# Patient Record
Sex: Female | Born: 1990 | Race: Black or African American | Hispanic: No | Marital: Single
Health system: Southern US, Community
[De-identification: ages and names within clinical notes are randomized; demographics above are authoritative.]

## PROBLEM LIST (undated history)

## (undated) DIAGNOSIS — F329 Major depressive disorder, single episode, unspecified: Secondary | ICD-10-CM

## (undated) DIAGNOSIS — F32A Depression, unspecified: Secondary | ICD-10-CM

## (undated) DIAGNOSIS — F259 Schizoaffective disorder, unspecified: Secondary | ICD-10-CM

## (undated) DIAGNOSIS — F988 Other specified behavioral and emotional disorders with onset usually occurring in childhood and adolescence: Secondary | ICD-10-CM

## (undated) HISTORY — PX: WISDOM TOOTH EXTRACTION: SHX21

---
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
## 2014-05-12 ENCOUNTER — Encounter (HOSPITAL_BASED_OUTPATIENT_CLINIC_OR_DEPARTMENT_OTHER): Payer: Self-pay | Admitting: *Deleted

## 2014-05-12 ENCOUNTER — Emergency Department (HOSPITAL_BASED_OUTPATIENT_CLINIC_OR_DEPARTMENT_OTHER): Payer: Medicaid - Out of State

## 2014-05-12 ENCOUNTER — Emergency Department (HOSPITAL_BASED_OUTPATIENT_CLINIC_OR_DEPARTMENT_OTHER)
Admission: EM | Admit: 2014-05-12 | Discharge: 2014-05-12 | Disposition: A | Discharge status: Home / Self Care | Payer: Medicaid - Out of State | Attending: Emergency Medicine | Admitting: Emergency Medicine

## 2014-05-12 DIAGNOSIS — X58XXXA Exposure to other specified factors, initial encounter: Secondary | ICD-10-CM | POA: Diagnosis not present

## 2014-05-12 DIAGNOSIS — F909 Attention-deficit hyperactivity disorder, unspecified type: Secondary | ICD-10-CM | POA: Diagnosis not present

## 2014-05-12 DIAGNOSIS — Y9389 Activity, other specified: Secondary | ICD-10-CM | POA: Insufficient documentation

## 2014-05-12 DIAGNOSIS — F329 Major depressive disorder, single episode, unspecified: Secondary | ICD-10-CM | POA: Insufficient documentation

## 2014-05-12 DIAGNOSIS — Z79899 Other long term (current) drug therapy: Secondary | ICD-10-CM | POA: Diagnosis not present

## 2014-05-12 DIAGNOSIS — Y9289 Other specified places as the place of occurrence of the external cause: Secondary | ICD-10-CM | POA: Diagnosis not present

## 2014-05-12 DIAGNOSIS — S99921A Unspecified injury of right foot, initial encounter: Secondary | ICD-10-CM | POA: Diagnosis present

## 2014-05-12 DIAGNOSIS — Y998 Other external cause status: Secondary | ICD-10-CM | POA: Insufficient documentation

## 2014-05-12 DIAGNOSIS — S92251A Displaced fracture of navicular [scaphoid] of right foot, initial encounter for closed fracture: Secondary | ICD-10-CM | POA: Diagnosis not present

## 2014-05-12 DIAGNOSIS — Z720 Tobacco use: Secondary | ICD-10-CM | POA: Insufficient documentation

## 2014-05-12 HISTORY — DX: Schizoaffective disorder, unspecified: F25.9

## 2014-05-12 HISTORY — DX: Other specified behavioral and emotional disorders with onset usually occurring in childhood and adolescence: F98.8

## 2014-05-12 HISTORY — DX: Depression, unspecified: F32.A

## 2014-05-12 HISTORY — DX: Major depressive disorder, single episode, unspecified: F32.9

## 2014-05-12 MED ORDER — KETOROLAC TROMETHAMINE 60 MG/2ML IM SOLN
60.0000 mg | Freq: Once | INTRAMUSCULAR | Status: AC
  Administered 2014-05-12: 60 mg via INTRAMUSCULAR
  Filled 2014-05-12: qty 2

## 2014-05-12 NOTE — ED Notes (Signed)
Pt amb to room 12 with quick steady gait in nad. Pt report she has been doing a new workout routine and has been walking a lot, "I know I'm wearing the wrong shoes, I wear converse, and they have no support..." pt reports pain to her right arch area, has tried massage, warm soaks, tylenol and aleve, no relief. Pt advised by this rn to get good arch supportive shoes for working out, or orthotic inserts for support and to try ice also.

## 2014-05-12 NOTE — ED Notes (Signed)
D/c home- f/u appt scheduled by pt while in ED- crutches, cam walker and ice pack for home use

## 2014-05-12 NOTE — Discharge Instructions (Signed)
Return to the emergency room with worsening of symptoms, new symptoms or with symptoms that are concerning, especially severe pain, fevers, numbness, tingling, swelling ,redness. RICE: Rest, Ice (three cycles of 20 mins on, off at least twice a day), compression/brace, elevation. Heating pad works well for back pain. Ibuprofen  (2 tablets ) every 5-6 hours for 3-5 days. Cam boot and weight bearing as tolerated. Call to make appointment with sports medicine above.  Read below information and follow recommendations.  Fracture A fracture is a break in a bone, due to a force on the bone that is greater than the bone's strength can handle. There are many types of fractures, including:  Complete fracture: The break passes completely through the bone.  Displaced: The ends of the bone fragments are not properly aligned.  Non-displaced: The ends of the bone fragments are in proper alignment.  Incomplete fracture (greenstick): The break does not pass completely through the bone. Incomplete fractures may or may not be angular (angulated).  Open fracture (compound): Part of the broken bone pokes through the skin. Open fractures have a high risk for infection.  Closed fracture: The fracture has not broken through the skin.  Comminuted fracture: The bone is broken into more than two pieces.  Compression fracture: The break occurs from extreme pressure on the bone (includes crushing injury).  Impacted fracture: The broken bone ends have been driven into each other.  Avulsion fracture: A ligament or tendon pulls a small piece of bone off from the main bony segment.  Pathologic fracture: A fracture due to the bone being made weak by a disease (osteoporosis or tumors).  Stress fracture: A fracture caused by intense exercise or repetitive and prolonged pressure that makes the bone weak. SYMPTOMS   Pain, tenderness, bleeding, bruising, and swelling at the fracture site.  Weakness  and inability to bear weight on the injured extremity.  Paleness and deformity (sometimes).  Loss of pulse, numbness, tingling, or paralysis below the fracture site (usually a limb); these are emergencies. CAUSES  Bone being subjected to a force greater than its strength. RISK INCREASES WITH:  Contact sports and falls from heights.  Previous or current bone problems (osteoporosis or tumors).  Poor balance.  Poor strength and flexibility. PREVENTION   Warm up and stretch properly before activity.  Maintain physical fitness:  Cardiovascular fitness.  Muscle strength.  Flexibility and endurance.  Wear proper protective equipment.  Use proper exercise technique. RELATED COMPLICATIONS   Bone fails to heal (nonunion).  Bone heals in a poor position (malunion).  Low blood volume (hypovolemic), shock due to blood loss.  Clump of fat cells travels through the blood (fat embolus) from the injury site to the lungs or brain (more common with thigh fractures).  Obstruction of nearby arteries. TREATMENT  Treatment first requires realigning of the bones (reduction) by a medically trained person, if the fracture is displaced. After realignment if the fracture is completed, or for non-displaced fractures, ice and medicine are used to reduce pain and inflammation. The bone and adjacent joints are then restrained with a splint, cast, or brace to allow the bones to heal without moving. Surgery is sometimes needed, to reposition the bones and hold the position with rods, pins, plates, or screws. Restraint for long periods of time may result in muscle and joint weakness or build up of fluid in tissues (edema). For this reason, physical therapy is often needed to regain strength and full range of motion. Recovery is complete when  there is no bone motion at the fracture site and x-rays (radiographs) show complete healing.  MEDICATION   General anesthesia, sedation, or muscle relaxants may be  needed to allow for realignment of the fracture. If pain medicine is needed, nonsteroidal anti-inflammatory medicines (aspirin and ibuprofen), or other minor pain relievers (acetaminophen), are often advised.  Do not take pain medicine for 7 days before surgery.  Stronger pain relievers may be prescribed by your caregiver. Use only as directed and only as much as you need. SEEK MEDICAL CARE IF:   The following occur after restraint or surgery. (Report any of these signs immediately):  Swelling above or below the fracture site.  Severe, persistent pain.  Blue or gray skin below the fracture site, especially under the nails. Numbness or loss of feeling below the fracture site. Document Released: 12/27/2004 Document Revised: 12/14/2011 Document Reviewed: 04/10/2008 [facility] Patient Information 2015 [place], [place]. This information is not intended to replace advice given to you by your health care provider. Make sure you discuss any questions you have with your health care provider.

## 2014-05-12 NOTE — ED Notes (Signed)
PA at bedside.

## 2014-05-12 NOTE — ED Provider Notes (Signed)
CSN: [id number]     Arrival date & time 05/12/14  1129 History   First MD Initiated Contact with Patient 05/12/14 1145     Chief Complaint  Patient presents with  . Foot Pain     (Consider location/radiation/quality/duration/timing/severity/associated sxs/prior Treatment) HPI  Erica Dixon is a 24 y.o. female with PMH of schizoaffective disorder, depression, ADHD presenting with pain to right arch area for 3 days that is intermittent and described as aching. She denies improvement with massage, warm says, Tylenol or Aleve. Patient states she has been working out more than usual which includes walking. She reports improvement of her symptoms with lace up sneaker instead of converse shoes. She denies numbness, tingling. No history of injury.   Past Medical History  Diagnosis Date  . Depression   . Schizoaffective disorder   . ADD (attention deficit disorder)    History reviewed. No pertinent past surgical history. History reviewed. No pertinent family history. History  Substance Use Topics  . Smoking status: Current Some Day Smoker  . Smokeless tobacco: Not on file  . Alcohol Use: Not on file   OB History    No data available     Review of Systems  Musculoskeletal: Positive for myalgias. Negative for joint swelling.  Skin: Negative for color change and wound.  Neurological: Negative for weakness and numbness.      Allergies  Review of patient's allergies indicates no known allergies.  Home Medications   Prior to Admission medications   Medication Sig Start Date End Date Taking? Authorizing Provider  buPROPion (WELLBUTRIN) 100 MG tablet Take 200 mg by mouth 2 (two) times daily.   Yes Historical Provider, MD  desogestrel-ethinyl estradiol (KARIVA,AZURETTE,MIRCETTE) 0.15-0.02/0.01 MG (21/5) tablet Take 1 tablet by mouth daily.   Yes Historical Provider, MD   BP 135/81 mmHg  Pulse 94  Temp(Src) 98.3 F (36.8 C) (Oral)  Resp 18  Ht 5\' 5"  (1.651 m)  Wt 212 lb (96.163  kg)  BMI 35.28 kg/m2  SpO2 100%  LMP 05/05/2014 Physical Exam  Constitutional: She appears well-developed and well-nourished. No distress.  HENT:  Head: Normocephalic and atraumatic.  Eyes: Conjunctivae are normal. Right eye exhibits no discharge. Left eye exhibits no discharge.  Cardiovascular:  2+ DP, PT pulses equal bilaterally  Pulmonary/Chest: Effort normal. No respiratory distress.  Musculoskeletal:  Tenderness to home or right arch without overlying skin changes. No other tenderness. Full range of motion of ankle without tenderness. Achilles tendon intact.  Neurological: She is alert. Coordination normal.  Strength and sensation intact.  Skin: She is not diaphoretic.  Psychiatric: She has a normal mood and affect. Her behavior is normal.  Nursing note and vitals reviewed.   ED Course  Procedures (including critical care time) Labs Review Labs Reviewed - No data to display  Imaging Review Dg Foot Complete Right  05/12/2014   CLINICAL DATA:  Right medial foot pain for 1 week, no known injury  EXAM: RIGHT FOOT COMPLETE - 3+ VIEW  COMPARISON:  None.  FINDINGS: Three views of the right foot submitted. There is subtle lucent line dorsal posterior aspect of navicular seen on lateral view. Osteochondral defect or nondisplaced fracture cannot be excluded. Clinical correlation is necessary.  IMPRESSION: There is subtle lucent line dorsal posterior aspect of navicular seen on lateral view. Osteochondral defect or nondisplaced fracture cannot be excluded. Clinical correlation is necessary.   Electronically Signed   By: Natasha MeadLiviu  Pop M.D.   On: 05/12/2014 12:59     EKG  Interpretation None      MDM   Final diagnoses:  Closed right tarsal navicular fracture, initial encounter   Patient presenting with right arch pain worse with activity. Neurovascularly intact. X-ray with evidence of possible nondisplaced fracture of navicular. Patient to be placed in cam boot and ambulate as tolerated.  She'll follow-up with sports medicine. Discussed rice and ibuprofen use.  Discussed return precautions with patient. Discussed all results and patient verbalizes understanding and agrees with plan.     Oswaldo Conroy, PA-C 05/12/14 1312  Blane Ohara, MD 05/14/14 [phone]

## 2014-05-14 ENCOUNTER — Ambulatory Visit (INDEPENDENT_AMBULATORY_CARE_PROVIDER_SITE_OTHER): Payer: 59 | Admitting: Family Medicine

## 2014-05-14 ENCOUNTER — Encounter: Payer: Self-pay | Admitting: Family Medicine

## 2014-05-14 VITALS — BP 128/88 | HR 112 | Ht 65.0 in | Wt 210.0 lb

## 2014-05-14 DIAGNOSIS — M79671 Pain in right foot: Secondary | ICD-10-CM

## 2014-05-14 NOTE — Patient Instructions (Signed)
You have plantar fasciitis While your x-rays showed a possible navicular fracture you have no tenderness here and your activity level isn't consistent with a stress fracture. Tylenol 500mg  1-2 tabs three times a day. Aleve 2 tabs twice a day with food OR ibuprofen 600mg  three times a day with food for pain and inflammation. Wear cam walker for next 2 weeks. Consider using crutches if severely painful but weight bear as tolerated. Follow up with me in 2 weeks.  At that time hopefully we can start you on the exercises we discussed: Plantar fascia stretch for 20-30 seconds (do 3 of these) in morning Lowering/raise on a step exercises 3 x 10 once or twice a day - this is very important for long term recovery. Can add heel walks, toe walks forward and backward as well  Ice for 15 minutes as needed. Avoid flat shoes/barefoot walking as much as possible. Arch straps have been shown to help with pain though  Orthotics with heel lift may be helpful (like dr. Jari Sportsmanscholls active series insoles).

## 2014-05-19 DIAGNOSIS — M79671 Pain in right foot: Secondary | ICD-10-CM | POA: Insufficient documentation

## 2014-05-19 NOTE — Progress Notes (Signed)
PCP: No PCP Per Patient  Subjective:   HPI: Patient is a 24 y.o. female here for right foot pain.  Patient reports she works out regularly, walking 5 miles at times. Was wearing converse when pain started. Tried tylenol. Pain level up to 9/10. Came to ED and told she had a navicular fracture, wearing cam boot and ambulating on this. No swelling, bruising.  Past Medical History  Diagnosis Date  . Depression   . Schizoaffective disorder   . ADD (attention deficit disorder)     Current Outpatient Prescriptions on File Prior to Visit  Medication Sig Dispense Refill  . buPROPion (WELLBUTRIN) 100 MG tablet Take 200 mg by mouth 2 (two) times daily.    Marland Kitchen. desogestrel-ethinyl estradiol (KARIVA,AZURETTE,MIRCETTE) 0.15-0.02/0.01 MG (21/5) tablet Take 1 tablet by mouth daily.     No current facility-administered medications on file prior to visit.    No past surgical history on file.  No Known Allergies  History   Social History  . Marital Status: Single    Spouse Name: N/A  . Number of Children: N/A  . Years of Education: N/A   Occupational History  . Not on file.   Social History Main Topics  . Smoking status: Current Some Day Smoker  . Smokeless tobacco: Not on file  . Alcohol Use: Not on file  . Drug Use: Not on file  . Sexual Activity: Not on file   Other Topics Concern  . Not on file   Social History Narrative    No family history on file.  BP 128/88 mmHg  Pulse 112  Ht 5\' 5"  (1.651 m)  Wt 210 lb (95.255 kg)  BMI 34.95 kg/m2  LMP 05/05/2014  Review of Systems: See HPI above.    Objective:  Physical Exam:  Gen: NAD  Right foot/ankle: No gross deformity, swelling, ecchymoses FROM ankle without pain. TTP insertion of plantar fascia on calcaneus, less within body of plantar fascia.  No tenderness of tarsal navicular. Negative ant drawer and talar tilt.   Negative syndesmotic compression. Negative metatarsal squeeze, calcaneal squeeze. Thompsons  test negative. NV intact distally.    Assessment & Plan:  1. Right foot pain - clinically she does not have a navicular fracture or stress fracture - no tenderness here at all and level of activity would not fit with a navicular stress fracture.  Instead, has severe plantar fasciitis.  Will continue with cam walker, crutches as needed.  Tylenol/nsaids, icing.  F/u in 2 weeks.  Hopefully can transition to a shoe with good arch support at that time.

## 2014-05-19 NOTE — Assessment & Plan Note (Signed)
clinically she does not have a navicular fracture or stress fracture - no tenderness here at all and level of activity would not fit with a navicular stress fracture.  Instead, has severe plantar fasciitis.  Will continue with cam walker, crutches as needed.  Tylenol/nsaids, icing.  F/u in 2 weeks.  Hopefully can transition to a shoe with good arch support at that time.

## 2014-06-14 ENCOUNTER — Emergency Department (HOSPITAL_BASED_OUTPATIENT_CLINIC_OR_DEPARTMENT_OTHER)
Admission: EM | Admit: 2014-06-14 | Discharge: 2014-06-14 | Disposition: A | Discharge status: Home / Self Care | Payer: 59 | Attending: Emergency Medicine | Admitting: Emergency Medicine

## 2014-06-14 ENCOUNTER — Encounter (HOSPITAL_BASED_OUTPATIENT_CLINIC_OR_DEPARTMENT_OTHER): Payer: Self-pay | Admitting: *Deleted

## 2014-06-14 DIAGNOSIS — Z79899 Other long term (current) drug therapy: Secondary | ICD-10-CM | POA: Insufficient documentation

## 2014-06-14 DIAGNOSIS — F329 Major depressive disorder, single episode, unspecified: Secondary | ICD-10-CM | POA: Diagnosis not present

## 2014-06-14 DIAGNOSIS — Z720 Tobacco use: Secondary | ICD-10-CM | POA: Diagnosis not present

## 2014-06-14 DIAGNOSIS — N760 Acute vaginitis: Secondary | ICD-10-CM | POA: Insufficient documentation

## 2014-06-14 DIAGNOSIS — R1084 Generalized abdominal pain: Secondary | ICD-10-CM | POA: Diagnosis present

## 2014-06-14 DIAGNOSIS — Z3202 Encounter for pregnancy test, result negative: Secondary | ICD-10-CM | POA: Diagnosis not present

## 2014-06-14 DIAGNOSIS — B9689 Other specified bacterial agents as the cause of diseases classified elsewhere: Secondary | ICD-10-CM

## 2014-06-14 DIAGNOSIS — R102 Pelvic and perineal pain: Secondary | ICD-10-CM

## 2014-06-14 LAB — URINALYSIS, ROUTINE W REFLEX MICROSCOPIC
Bilirubin Urine: NEGATIVE
GLUCOSE, UA: NEGATIVE mg/dL
Hgb urine dipstick: NEGATIVE
Ketones, ur: NEGATIVE mg/dL
Leukocytes, UA: NEGATIVE
NITRITE: NEGATIVE
PH: 7 (ref 5.0–8.0)
Protein, ur: NEGATIVE mg/dL
Specific Gravity, Urine: 1.005 (ref 1.005–1.030)
UROBILINOGEN UA: 0.2 mg/dL (ref 0.0–1.0)

## 2014-06-14 LAB — WET PREP, GENITAL
Trich, Wet Prep: NONE SEEN
Yeast Wet Prep HPF POC: NONE SEEN

## 2014-06-14 LAB — PREGNANCY, URINE: Preg Test, Ur: NEGATIVE

## 2014-06-14 MED ORDER — AZITHROMYCIN 250 MG PO TABS
1000.0000 mg | ORAL_TABLET | Freq: Once | ORAL | Status: AC
  Administered 2014-06-14: 1000 mg via ORAL
  Filled 2014-06-14: qty 4

## 2014-06-14 MED ORDER — LIDOCAINE HCL (PF) 1 % IJ SOLN
INTRAMUSCULAR | Status: AC
  Administered 2014-06-14: 0.9 mL
  Filled 2014-06-14: qty 5

## 2014-06-14 MED ORDER — METRONIDAZOLE 500 MG PO TABS
500.0000 mg | ORAL_TABLET | Freq: Two times a day (BID) | ORAL | Status: DC
Start: 2014-06-14 — End: 2019-08-17

## 2014-06-14 MED ORDER — CEFTRIAXONE SODIUM 250 MG IJ SOLR
250.0000 mg | Freq: Once | INTRAMUSCULAR | Status: AC
  Administered 2014-06-14: 250 mg via INTRAMUSCULAR
  Filled 2014-06-14: qty 250

## 2014-06-14 NOTE — ED Notes (Signed)
Pt is here with lower abdominal cramping (and pain when bending over) and she has some spotting although its not time for her period, no vaginal discharge, itching or odor.  Pt reports unprotected sex with new partner. No urinary symptoms with this.

## 2014-06-14 NOTE — Discharge Instructions (Signed)
Flagyl as prescribed.  Ibuprofen 600 mg every 6 hours as needed for pain.  Return to the emergency department if symptoms significantly worsen or change.   Pelvic Pain Female pelvic pain can be caused by many different things and start from a variety of places. Pelvic pain refers to pain that is located in the lower half of the abdomen and between your hips. The pain may occur over a short period of time (acute) or may be reoccurring (chronic). The cause of pelvic pain may be related to disorders affecting the female reproductive organs (gynecologic), but it may also be related to the bladder, kidney stones, an intestinal complication, or muscle or skeletal problems. Getting help right away for pelvic pain is important, especially if there has been severe, sharp, or a sudden onset of unusual pain. It is also important to get help right away because some types of pelvic pain can be life threatening.  CAUSES  Below are only some of the causes of pelvic pain. The causes of pelvic pain can be in one of several categories.   Gynecologic.  Pelvic inflammatory disease.  Sexually transmitted infection.  Ovarian cyst or a twisted ovarian ligament (ovarian torsion).  Uterine lining that grows outside the uterus (endometriosis).  Fibroids, cysts, or tumors.  Ovulation.  Pregnancy.  Pregnancy that occurs outside the uterus (ectopic pregnancy).  Miscarriage.  Labor.  Abruption of the placenta or ruptured uterus.  Infection.  Uterine infection (endometritis).  Bladder infection.  Diverticulitis.  Miscarriage related to a uterine infection (septic abortion).  Bladder.  Inflammation of the bladder (cystitis).  Kidney stone(s).  Gastrointestinal.  Constipation.  Diverticulitis.  Neurologic.  Trauma.  Feeling pelvic pain because of mental or emotional causes (psychosomatic).  Cancers of the bowel or pelvis. EVALUATION  Your caregiver will want to take a careful history  of your concerns. This includes recent changes in your health, a careful gynecologic history of your periods (menses), and a sexual history. Obtaining your family history and medical history is also important. Your caregiver may suggest a pelvic exam. A pelvic exam will help identify the location and severity of the pain. It also helps in the evaluation of which organ system may be involved. In order to identify the cause of the pelvic pain and be properly treated, your caregiver may order tests. These tests may include:   A pregnancy test.  Pelvic ultrasonography.  An X-ray exam of the abdomen.  A urinalysis or evaluation of vaginal discharge.  Blood tests. HOME CARE INSTRUCTIONS   Only take over-the-counter or prescription medicines for pain, discomfort, or fever as directed by your caregiver.   Rest as directed by your caregiver.   Eat a balanced diet.   Drink enough fluids to make your urine clear or pale yellow, or as directed.   Avoid sexual intercourse if it causes pain.   Apply warm or cold compresses to the lower abdomen depending on which one helps the pain.   Avoid stressful situations.   Keep a journal of your pelvic pain. Write down when it started, where the pain is located, and if there are things that seem to be associated with the pain, such as food or your menstrual cycle.  Follow up with your caregiver as directed.  SEEK MEDICAL CARE IF:  Your medicine does not help your pain.  You have abnormal vaginal discharge. SEEK IMMEDIATE MEDICAL CARE IF:   You have heavy bleeding from the vagina.   Your pelvic pain increases.  You feel light-headed or faint.   You have chills.   You have pain with urination or blood in your urine.   You have uncontrolled diarrhea or vomiting.   You have a fever or persistent symptoms for more than 3 days.  You have a fever and your symptoms suddenly get worse.   You are being physically or sexually  abused.  MAKE SURE YOU:  Understand these instructions.  Will watch your condition.  Will get help if you are not doing well or get worse. Document Released: 11/24/2003 Document Revised: 05/13/2013 Document Reviewed: 04/18/2011 [facility] Patient Information 2015 [place], [place]. This information is not intended to replace advice given to you by your health care provider. Make sure you discuss any questions you have with your health care provider.  Bacterial Vaginosis Bacterial vaginosis is an infection of the vagina. It happens when too many of certain germs (bacteria) grow in the vagina. HOME CARE  Take your medicine as told by your doctor.  Finish your medicine even if you start to feel better.  Do not have sex until you finish your medicine and are better.  Tell your sex partner that you have an infection. They should see their doctor for treatment.  Practice safe sex. Use condoms. Have only one sex partner. GET HELP IF:  You are not getting better after 3 days of treatment.  You have more grey fluid (discharge) coming from your vagina than before.  You have more pain than before.  You have a fever. MAKE SURE YOU:   Understand these instructions.  Will watch your condition.  Will get help right away if you are not doing well or get worse. Document Released: 10/06/2007 Document Revised: 10/17/2012 Document Reviewed: 08/08/2012 [facility] Patient Information 2015 [place], [place]. This information is not intended to replace advice given to you by your health care provider. Make sure you discuss any questions you have with your health care provider.

## 2014-06-14 NOTE — ED Provider Notes (Signed)
CSN: [id number]     Arrival date & time 06/14/14  1403 History  This chart was scribed for Dr. Geoffery Lyons, MD by Lyndel Safe, ED Scribe. This patient was seen in room MH09/MH09 and the patient's care was started 3:35 PM.  Chief Complaint  Patient presents with  . Abdominal Cramping   Patient is a 24 y.o. female presenting with cramps. The history is provided by the patient. No language interpreter was used.  Abdominal Cramping This is a new problem. The current episode started more than 2 days ago. The problem occurs daily. The problem has not changed since onset.Associated symptoms include abdominal pain. Pertinent negatives include no chest pain, no headaches and no shortness of breath. The symptoms are relieved by acetaminophen. Erica Dixon has tried acetaminophen for the symptoms. The treatment provided moderate relief.   HPI Comments: Erica Dixon is a 24 y.o. female who presents to the Emergency Department complaining of intermittent, moderate, suprapubic abdominal cramping onset 5 days ago. Erica Dixon reports associated vaginal spotting and pain when Erica Dixon lays down. The patient states Erica Dixon has recently had unprotected sex with a new partner. Erica Dixon notes Erica Dixon is on oral birthcontrol. Erica Dixon has taken tylenol pta which Erica Dixon reports alleviated the pain. Erica Dixon also notes that Erica Dixon had a BM while here and that relieved the pain. Erica Dixon states Erica Dixon does not feel the urge to urinate but will urinate when Erica Dixon goes to the bathroom. Her LNMP was 3 weeks ago. 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 Erica Dixon denies a chance of pregnancy, vaginal discharge, vaginal itching, malodorous, no changes in urinary habits.   Past Medical History  Diagnosis Date  . Depression   . Schizoaffective disorder   . ADD (attention deficit disorder)    History reviewed. No pertinent past surgical history. No family history on file. History  Substance Use Topics  . Smoking status: Current Some Day  Smoker  . Smokeless tobacco: Not on file  . Alcohol Use: Not on file   OB History    No data available     Review of Systems  Constitutional: Negative for fever.  Respiratory: Negative for shortness of breath.   Cardiovascular: Negative for chest pain.  Gastrointestinal: Positive for abdominal pain.  Genitourinary: Positive for vaginal bleeding. Negative for dysuria, urgency, frequency, vaginal discharge and difficulty urinating.  Neurological: Negative for headaches.   A complete 10 system review of systems was obtained and is otherwise negative except at noted in the HPI and PMH.  Allergies  Review of patient's allergies indicates no known allergies.  Home Medications   Prior to Admission medications   Medication Sig Start Date End Date Taking? Authorizing Provider  buPROPion (WELLBUTRIN) 100 MG tablet Take 150 mg by mouth 2 (two) times daily.     Historical Provider, MD  desogestrel-ethinyl estradiol (KARIVA,AZURETTE,MIRCETTE) 0.15-0.02/0.01 MG (21/5) tablet Take 1 tablet by mouth daily.    Historical Provider, MD   BP 124/81 mmHg  Pulse 110  Temp(Src) 98.6 F (37 C) (Oral)  Resp 20  Wt 215 lb (97.523 kg)  SpO2 99%  LMP 05/24/2014   Physical Exam  Constitutional: Erica Dixon appears well-developed and well-nourished.  HENT:  Head: Normocephalic and atraumatic.  Eyes: Conjunctivae are normal. Right eye exhibits no discharge. Left eye exhibits no discharge. No scleral icterus.  Cardiovascular: Normal rate, regular rhythm and normal heart sounds.   Pulmonary/Chest: Effort normal and breath sounds normal. No respiratory distress.  Abdominal: Soft. There is tenderness.  There is no rebound and no guarding.  TTP in suprapubic region and LLQ.  Genitourinary: Vaginal discharge found.  There is a purulent discharge present coming from the cervical os and a whitish discharge present in the vaginal vault. There is no cervical motion tenderness and no adnexal masses.  Neurological: Erica Dixon  is alert. Coordination normal.  Skin: Skin is warm and dry. No rash noted. Erica Dixon is not diaphoretic. No erythema.  Psychiatric: Erica Dixon has a normal mood and affect.  Nursing note and vitals reviewed.  ED Course  Procedures  DIAGNOSTIC STUDIES: Oxygen Saturation is 99% on RA, normal by my interpretation.    COORDINATION OF CARE: 3:40 PM Discussed treatment plan which includes negative for pregnancy or UTI. Plan to to a pelvic exam and diagnostic labs to rule out an infection. Pt acknowledges and agrees to plan.  5:44 PM Pelvic exam performed with chaperone present.  Pelvic exam: Results as above   Labs Review Labs Reviewed  WET PREP, GENITAL - Abnormal; Notable for the following:    Clue Cells Wet Prep HPF POC MODERATE (*)    WBC, Wet Prep HPF POC TOO NUMEROUS TO COUNT (*)    All other components within normal limits  URINALYSIS, ROUTINE W REFLEX MICROSCOPIC (NOT [facility]) - Abnormal; Notable for the following:    Color, Urine STRAW (*)    All other components within normal limits  PREGNANCY, URINE  GC/CHLAMYDIA PROBE AMP ([place]) NOT AT [facility]    Imaging Review No results found.   EKG Interpretation None      MDM   Final diagnoses:  None    Pelvic examination reveals purulent discharge and wet prep shows too numerous to count whites and moderate clues. This will be treated with Rocephin and Zithromax, and Erica Dixon will be discharged with a prescription for Flagyl.  I personally performed the services described in this documentation, which was scribed in my presence. The recorded information has been reviewed and is accurate.      Geoffery Lyonsouglas Kaycen Whitworth, MD 06/14/14 [phone]

## 2014-06-16 LAB — GC/CHLAMYDIA PROBE AMP (~~LOC~~) NOT AT ARMC
Chlamydia: POSITIVE — AB
Neisseria Gonorrhea: POSITIVE — AB

## 2014-06-17 ENCOUNTER — Telehealth (HOSPITAL_COMMUNITY): Payer: Self-pay

## 2014-06-17 NOTE — ED Notes (Signed)
Positive for gonorrhea and chlamydia. Treated per protocol. [employer] faxed. Attempting to contact pt.

## 2014-06-20 ENCOUNTER — Telehealth: Payer: Self-pay | Admitting: *Deleted

## 2018-03-09 ENCOUNTER — Encounter (HOSPITAL_COMMUNITY): Payer: Self-pay

## 2018-03-09 ENCOUNTER — Emergency Department (HOSPITAL_COMMUNITY): Payer: Medicaid Other

## 2018-03-09 ENCOUNTER — Emergency Department (HOSPITAL_COMMUNITY)
Admission: EM | Admit: 2018-03-09 | Discharge: 2018-03-10 | Disposition: A | Discharge status: Home / Self Care | Payer: Medicaid Other | Attending: Emergency Medicine | Admitting: Emergency Medicine

## 2018-03-09 DIAGNOSIS — S0990XA Unspecified injury of head, initial encounter: Secondary | ICD-10-CM | POA: Diagnosis present

## 2018-03-09 DIAGNOSIS — S060X9A Concussion with loss of consciousness of unspecified duration, initial encounter: Secondary | ICD-10-CM | POA: Diagnosis not present

## 2018-03-09 DIAGNOSIS — Y999 Unspecified external cause status: Secondary | ICD-10-CM | POA: Diagnosis not present

## 2018-03-09 DIAGNOSIS — Y92411 Interstate highway as the place of occurrence of the external cause: Secondary | ICD-10-CM | POA: Insufficient documentation

## 2018-03-09 DIAGNOSIS — S298XXA Other specified injuries of thorax, initial encounter: Secondary | ICD-10-CM | POA: Diagnosis not present

## 2018-03-09 DIAGNOSIS — Y9389 Activity, other specified: Secondary | ICD-10-CM | POA: Diagnosis not present

## 2018-03-09 DIAGNOSIS — S161XXA Strain of muscle, fascia and tendon at neck level, initial encounter: Secondary | ICD-10-CM

## 2018-03-09 MED ORDER — SODIUM CHLORIDE 0.9 % IV BOLUS (SEPSIS)
1000.0000 mL | Freq: Once | INTRAVENOUS | Status: AC
  Administered 2018-03-09: 1000 mL via INTRAVENOUS
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
## 2018-03-10 ENCOUNTER — Emergency Department (HOSPITAL_COMMUNITY): Payer: Medicaid Other

## 2018-03-10 LAB — COMPREHENSIVE METABOLIC PANEL
ALT: 14 U/L (ref 0–44)
AST: 16 U/L (ref 15–41)
Albumin: 3.5 g/dL (ref 3.5–5.0)
Alkaline Phosphatase: 67 U/L (ref 38–126)
Anion gap: 6 (ref 5–15)
BILIRUBIN TOTAL: 0.4 mg/dL (ref 0.3–1.2)
BUN: 5 mg/dL — ABNORMAL LOW (ref 6–20)
CO2: 21 mmol/L — ABNORMAL LOW (ref 22–32)
Calcium: 8.3 mg/dL — ABNORMAL LOW (ref 8.9–10.3)
Chloride: 111 mmol/L (ref 98–111)
Creatinine, Ser: 1.05 mg/dL — ABNORMAL HIGH (ref 0.44–1.00)
GFR calc Af Amer: >60 mL/min (ref >60)
GFR calc non Af Amer: >60 mL/min (ref >60)
Glucose, Bld: 125 mg/dL — ABNORMAL HIGH (ref 70–99)
Potassium: 3.4 mmol/L — ABNORMAL LOW (ref 3.5–5.1)
Sodium: 138 mmol/L (ref 135–145)
Total Protein: 6.6 g/dL (ref 6.5–8.1)

## 2018-03-10 LAB — CBC
HCT: 39.2 % (ref 36.0–46.0)
Hemoglobin: 12.3 g/dL (ref 12.0–15.0)
MCH: 29.4 pg (ref 26.0–34.0)
MCHC: 31.4 g/dL (ref 30.0–36.0)
MCV: 93.6 fL (ref 80.0–100.0)
Platelets: 278 10*3/uL (ref 150–400)
RBC: 4.19 MIL/uL (ref 3.87–5.11)
RDW: 14.4 % (ref 11.5–15.5)
WBC: 10.3 10*3/uL (ref 4.0–10.5)
nRBC: 0 % (ref 0.0–0.2)

## 2018-03-10 LAB — I-STAT BETA HCG BLOOD, ED (MC, WL, AP ONLY): I-stat hCG, quantitative: <5 m[IU]/mL (ref <5)

## 2018-03-10 LAB — CDS SEROLOGY

## 2018-03-10 LAB — LACTIC ACID, PLASMA: Lactic Acid, Venous: 1.8 mmol/L (ref 0.5–1.9)

## 2018-03-10 NOTE — ED Notes (Signed)
Pt ambulated to bathroom without difficulty. Pt given water

## 2018-03-10 NOTE — ED Notes (Signed)
Pt enroute to bathroom.

## 2018-03-10 NOTE — ED Notes (Signed)
[facility] blood draw.

## 2018-03-10 NOTE — ED Notes (Signed)
Patient transported to CT 

## 2018-03-12 ENCOUNTER — Encounter (HOSPITAL_BASED_OUTPATIENT_CLINIC_OR_DEPARTMENT_OTHER): Payer: Self-pay | Admitting: *Deleted

## 2018-07-13 ENCOUNTER — Emergency Department (HOSPITAL_COMMUNITY): Payer: Medicaid Other

## 2018-07-13 ENCOUNTER — Other Ambulatory Visit: Payer: Self-pay

## 2018-07-13 ENCOUNTER — Encounter (HOSPITAL_COMMUNITY): Payer: Self-pay | Admitting: Emergency Medicine

## 2018-07-13 ENCOUNTER — Emergency Department (HOSPITAL_COMMUNITY)
Admission: EM | Admit: 2018-07-13 | Discharge: 2018-07-14 | Disposition: A | Discharge status: Home / Self Care | Payer: Medicaid Other | Attending: Emergency Medicine | Admitting: Emergency Medicine

## 2018-07-13 DIAGNOSIS — Z20828 Contact with and (suspected) exposure to other viral communicable diseases: Secondary | ICD-10-CM | POA: Diagnosis not present

## 2018-07-13 DIAGNOSIS — F250 Schizoaffective disorder, bipolar type: Secondary | ICD-10-CM | POA: Diagnosis not present

## 2018-07-13 DIAGNOSIS — R4182 Altered mental status, unspecified: Secondary | ICD-10-CM

## 2018-07-13 DIAGNOSIS — F332 Major depressive disorder, recurrent severe without psychotic features: Secondary | ICD-10-CM | POA: Insufficient documentation

## 2018-07-13 DIAGNOSIS — R462 Strange and inexplicable behavior: Secondary | ICD-10-CM

## 2018-07-13 LAB — COMPREHENSIVE METABOLIC PANEL
ALT: 21 U/L (ref 0–44)
AST: 22 U/L (ref 15–41)
Albumin: 3.7 g/dL (ref 3.5–5.0)
Alkaline Phosphatase: 75 U/L (ref 38–126)
Anion gap: 11 (ref 5–15)
BUN: 8 mg/dL (ref 6–20)
CO2: 18 mmol/L — ABNORMAL LOW (ref 22–32)
Calcium: 8.7 mg/dL — ABNORMAL LOW (ref 8.9–10.3)
Chloride: 111 mmol/L (ref 98–111)
Creatinine, Ser: 1.07 mg/dL — ABNORMAL HIGH (ref 0.44–1.00)
GFR calc Af Amer: >60 mL/min (ref >60)
GFR calc non Af Amer: >60 mL/min (ref >60)
Glucose, Bld: 108 mg/dL — ABNORMAL HIGH (ref 70–99)
Potassium: 3.7 mmol/L (ref 3.5–5.1)
Sodium: 140 mmol/L (ref 135–145)
Total Bilirubin: 0.5 mg/dL (ref 0.3–1.2)
Total Protein: 7.2 g/dL (ref 6.5–8.1)

## 2018-07-13 LAB — CBC WITH DIFFERENTIAL/PLATELET
Abs Immature Granulocytes: 0.01 10*3/uL (ref 0.00–0.07)
Basophils Absolute: 0 10*3/uL (ref 0.0–0.1)
Basophils Relative: 0 %
Eosinophils Absolute: 0 10*3/uL (ref 0.0–0.5)
Eosinophils Relative: 0 %
HCT: 36.6 % (ref 36.0–46.0)
Hemoglobin: 11.6 g/dL — ABNORMAL LOW (ref 12.0–15.0)
Immature Granulocytes: 0 %
Lymphocytes Relative: 26 %
Lymphs Abs: 1.2 10*3/uL (ref 0.7–4.0)
MCH: 28.6 pg (ref 26.0–34.0)
MCHC: 31.7 g/dL (ref 30.0–36.0)
MCV: 90.4 fL (ref 80.0–100.0)
Monocytes Absolute: 0.3 10*3/uL (ref 0.1–1.0)
Monocytes Relative: 5 %
Neutro Abs: 3.3 10*3/uL (ref 1.7–7.7)
Neutrophils Relative %: 69 %
Platelets: 216 10*3/uL (ref 150–400)
RBC: 4.05 MIL/uL (ref 3.87–5.11)
RDW: 15.6 % — ABNORMAL HIGH (ref 11.5–15.5)
WBC: 4.8 10*3/uL (ref 4.0–10.5)
nRBC: 0 % (ref 0.0–0.2)

## 2018-07-13 LAB — RAPID URINE DRUG SCREEN, HOSP PERFORMED
Amphetamines: NOT DETECTED
Barbiturates: NOT DETECTED
Benzodiazepines: POSITIVE — AB
Cocaine: POSITIVE — AB
Opiates: NOT DETECTED
Tetrahydrocannabinol: NOT DETECTED

## 2018-07-13 LAB — ETHANOL: Alcohol, Ethyl (B): 170 mg/dL — ABNORMAL HIGH (ref <10)

## 2018-07-13 LAB — SARS CORONAVIRUS 2 BY RT PCR (HOSPITAL ORDER, PERFORMED IN ~~LOC~~ HOSPITAL LAB): SARS Coronavirus 2: NEGATIVE

## 2018-07-13 LAB — CBG MONITORING, ED: Glucose-Capillary: 97 mg/dL (ref 70–99)

## 2018-07-13 LAB — ACETAMINOPHEN LEVEL: Acetaminophen (Tylenol), Serum: <10 ug/mL — ABNORMAL LOW (ref 10–30)

## 2018-07-13 LAB — I-STAT BETA HCG BLOOD, ED (MC, WL, AP ONLY): I-stat hCG, quantitative: <5 m[IU]/mL (ref <5)

## 2018-07-13 LAB — SALICYLATE LEVEL: Salicylate Lvl: <7 mg/dL (ref 2.8–30.0)

## 2018-07-13 MED ORDER — SODIUM CHLORIDE 0.9 % IV SOLN
Freq: Once | INTRAVENOUS | Status: AC
  Administered 2018-07-13: 16:00:00 via INTRAVENOUS

## 2018-07-13 MED ORDER — SODIUM CHLORIDE 0.9 % IV BOLUS
1000.0000 mL | Freq: Once | INTRAVENOUS | Status: AC
  Administered 2018-07-13: 1000 mL via INTRAVENOUS

## 2018-07-13 MED ORDER — SODIUM CHLORIDE 0.9 % IV BOLUS
1000.0000 mL | Freq: Once | INTRAVENOUS | Status: AC
  Administered 2018-07-13: 11:00:00 1000 mL via INTRAVENOUS
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
## 2018-07-13 NOTE — BH Assessment (Signed)
Pt was sedated and isn't fully alert at this time.  TTS will see the pt when she is more alert.

## 2018-07-13 NOTE — ED Notes (Signed)
Pt completed TTS.  

## 2018-07-13 NOTE — ED Notes (Signed)
Pt ambulated to restroom with steady gait and tolerated well 

## 2018-07-13 NOTE — BH Assessment (Signed)
[place] Assessment Progress Note  Per Money NP patient will be observed and monitored.

## 2018-07-13 NOTE — ED Notes (Signed)
Pt. Ambulated to room 48 from room 30 well with steady gait.
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
## 2018-07-13 NOTE — ED Notes (Signed)
Please call Marya Amsler (dad) for update. Also daughter may need his number since IPAD is stored. [phone]

## 2018-07-13 NOTE — ED Notes (Signed)
Pt. Belongings placed in locker #1 and inventoried as well as ipad and wallet taken to security.
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
## 2018-07-13 NOTE — ED Notes (Signed)
TTS called. Pt to drowsy to assess. Will call back in a few hours

## 2018-07-14 DIAGNOSIS — F332 Major depressive disorder, recurrent severe without psychotic features: Secondary | ICD-10-CM | POA: Diagnosis present

## 2018-07-14 MED ORDER — QUETIAPINE FUMARATE 200 MG PO TABS: 200.0000 mg | ORAL_TABLET | Freq: Every evening | ORAL | 0 refills | Status: AC | PRN

## 2018-07-14 MED ORDER — ALBUTEROL SULFATE HFA 108 (90 BASE) MCG/ACT IN AERS: 2.0000 | INHALATION_SPRAY | Freq: Four times a day (QID) | RESPIRATORY_TRACT | 0 refills | Status: AC | PRN

## 2018-07-14 MED ORDER — MONTELUKAST SODIUM 10 MG PO TABS: 10.0000 mg | ORAL_TABLET | Freq: Every day | ORAL | 0 refills | Status: AC

## 2018-07-14 NOTE — Discharge Instructions (Signed)
Take medications as prescribed. Follow up as discussed with behavioral health. Limited supply of your regular medications, you will need to see your prescribed for refill.

## 2018-07-14 NOTE — ED Notes (Signed)
Breakfast tray ordered 

## 2018-07-14 NOTE — Progress Notes (Signed)
CSW faxed over outpatient mental health resources for patient.   Chalmers Guest. Guerry Bruin, MSW, [place] Work/Disposition Phone: [phone] Fax: [phone]

## 2018-07-14 NOTE — ED Notes (Addendum)
[facility] - Letter to Disclose on chart. ALL belongings - 1 labeled belongings and 1 valuables envelope given to [employer] - Pt aware. Rx and D/C paperwork w/resources from [facility] given to [employer].
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
## 2018-07-14 NOTE — ED Provider Notes (Signed)
Emergency Medicine Observation Re-evaluation Note  Erica Dixon is a 28 y.o. female, seen on rounds today.  Pt initially presented to the ED for complaints of Altered Mental Status Currently, the patient is resting in bed.  Physical Exam  BP 108/76 (BP Location: Right Arm)   Pulse 66   Temp 98.5 F (36.9 C) (Oral)   Resp 18   SpO2 100%  Physical Exam Alert, speech clear, denies SI/HI, feels safe for discharge plan ED Course / MDM  EKG:EKG Interpretation  Date/Time:  Friday July 13 2018 09:27:47 EDT Ventricular Rate:  109 PR Interval:    QRS Duration: 92 QT Interval:  342 QTC Calculation: 461 R Axis:   12 Text Interpretation:  Sinus tachycardia Ventricular premature complex No old tracing to compare Confirmed by Lacretia Leigh ([id number]) on 07/13/2018 10:11:46 AM    I have reviewed the labs performed to date as well as medications administered while in observation.  Recent changes in the last 24 hours include stable and appropriate through bh process. Plan  Current plan is for dc for outpatient follow up, given 1 week of her seroquel. Patient is under full IVC at this time, reversed by ER provider after [facility] evaluation.   Tacy Learn, PA-C 07/14/18 1025    Tegeler, Gwenyth Allegra, MD 07/14/18 [phone]

## 2018-07-14 NOTE — ED Notes (Signed)
IVC papers rescinded by Dr Sherry Ruffing - Copy faxed to [place] to Medical Records - Original placed in folder [place].

## 2019-06-29 ENCOUNTER — Encounter (HOSPITAL_COMMUNITY): Payer: Self-pay

## 2019-06-29 ENCOUNTER — Ambulatory Visit (HOSPITAL_COMMUNITY)
Admission: EM | Admit: 2019-06-29 | Discharge: 2019-06-29 | Disposition: A | Discharge status: Home / Self Care | Payer: Medicaid Other | Attending: Family Medicine | Admitting: Family Medicine

## 2019-06-29 ENCOUNTER — Other Ambulatory Visit: Payer: Self-pay

## 2019-06-29 DIAGNOSIS — J029 Acute pharyngitis, unspecified: Secondary | ICD-10-CM | POA: Insufficient documentation

## 2019-06-29 DIAGNOSIS — H9209 Otalgia, unspecified ear: Secondary | ICD-10-CM

## 2019-06-29 DIAGNOSIS — Z20822 Contact with and (suspected) exposure to covid-19: Secondary | ICD-10-CM | POA: Insufficient documentation

## 2019-06-29 DIAGNOSIS — F17200 Nicotine dependence, unspecified, uncomplicated: Secondary | ICD-10-CM | POA: Insufficient documentation

## 2019-06-29 LAB — POCT RAPID STREP A: Streptococcus, Group A Screen (Direct): NEGATIVE

## 2019-06-29 MED ORDER — AMOXICILLIN 875 MG PO TABS: 875.0000 mg | ORAL_TABLET | Freq: Two times a day (BID) | ORAL | 0 refills | Status: AC

## 2019-06-29 NOTE — ED Triage Notes (Signed)
Pt c/o sore throat, dysphagia, right ear painx4 days.

## 2019-06-29 NOTE — ED Provider Notes (Addendum)
[facility]   [id number] 06/29/19 Arrival Time: 1435  ASSESSMENT & PLAN:  1. Sore throat     No signs of peritonsillar abscess. Will cover for strep given exam. COVID testing sent. Discussed. COVID testing sent.  Begin: Meds ordered this encounter  Medications  . amoxicillin (AMOXIL) 875 MG tablet    Sig: Take 1 tablet (875 mg total) by mouth 2 (two) times daily for 10 days.    Dispense:  20 tablet    Refill:  0    Results for orders placed or performed during the hospital encounter of 06/29/19  POCT rapid strep A [facility])  Result Value Ref Range   Streptococcus, Group A Screen (Direct) NEGATIVE NEGATIVE   Labs Reviewed  SARS CORONAVIRUS 2 (TAT 6-24 HRS)  CULTURE, GROUP A STREP [facility])  POCT RAPID STREP A    OTC analgesics and throat care as needed  Instructed to finish full 10 day course of antibiotics. Will follow up if not showing significant improvement over the next 24-48 hours.  Reviewed expectations re: course of current medical issues. Questions answered. Outlined signs and symptoms indicating need for more acute intervention. Patient verbalized understanding. After Visit Summary given.   SUBJECTIVE:  Erica Dixon is a 29 y.o. female who reports a sore throat. Describes as significant r-sided sharp pain, esp with swallowing. Onset abrupt beginning 3-4 d ago. Symptoms have gradually worsened since beginning; without voice changes. No respiratory symptoms. Normal PO intake but reports discomfort with swallowing. No specific alleviating factors. Fever: believed to be present, temp not taken. No neck pain or swelling. No associated nausea, vomiting, or abdominal pain. Known sick contacts: none. Recent travel: none. OTC treatment: none reported.     OBJECTIVE:  Vitals:   06/29/19 1522 06/29/19 1523  BP: 123/79   Pulse: (!) 111   Resp: 18   Temp: 99.4 F (37.4 C)   TempSrc: Oral   SpO2: 100%   Weight:  104.3 kg  Height:  5\' 6"   (1.676 m)     General appearance: alert; no distress HEENT: throat with marked erythema and with tonsillar hypertrophy on the R; uvula is midline Neck: supple with FROM; moderate tender LAD on cervical R CV: regular; tachycardia noted Lungs: speaks full sentences without difficulty; unlabored Abd: soft; non-tender Skin: reveals no rash; warm and dry Psychological: alert and cooperative; normal mood and affect  No Known Allergies  Past Medical History:  Diagnosis Date  . ADD (attention deficit disorder)   . Depression   . Schizoaffective disorder [facility])    Social History   Socioeconomic History  . Marital status: Single    Spouse name: Not on file  . Number of children: Not on file  . Years of education: Not on file  . Highest education level: Not on file  Occupational History  . Not on file  Tobacco Use  . Smoking status: Current Some Day Smoker  . Smokeless tobacco: Never Used  Substance and Sexual Activity  . Alcohol use: Yes    Alcohol/week: 6.0 standard drinks    Types: 6 Glasses of wine per week  . Drug use: Never  . Sexual activity: Not on file  Other Topics Concern  . Not on file  Social History Narrative   ** Merged History Encounter **       Social Determinants of Health   Financial Resource Strain:   . Difficulty of Paying Living Expenses:   Food Insecurity:   . Worried About Running  Out of Food in the Last Year:   . [place] in the Last Year:   Transportation Needs:   . Lack of Transportation (Medical):   Marland Kitchen Lack of Transportation (Non-Medical):   Physical Activity:   . Days of Exercise per Week:   . Minutes of Exercise per Session:   Stress:   . Feeling of Stress :   Social Connections:   . Frequency of Communication with Friends and Family:   . Frequency of Social Gatherings with Friends and Family:   . Attends Religious Services:   . Active Member of Clubs or Organizations:   . Attends Archivist Meetings:   Marland Kitchen Marital  Status:   Intimate Partner Violence:   . Fear of Current or Ex-Partner:   . Emotionally Abused:   Marland Kitchen Physically Abused:   . Sexually Abused:    No family history on file.        Vanessa Kick, MD 07/01/19 [id number]    Vanessa Kick, MD 07/01/19 [phone]

## 2019-06-30 LAB — SARS CORONAVIRUS 2 (TAT 6-24 HRS): SARS Coronavirus 2: NEGATIVE

## 2019-07-02 LAB — CULTURE, GROUP A STREP (THRC)

## 2019-08-15 ENCOUNTER — Observation Stay (HOSPITAL_COMMUNITY)
Admission: EM | Admit: 2019-08-15 | Discharge: 2019-08-17 | Disposition: A | Discharge status: Home / Self Care | Payer: Medicaid Other | Attending: Family Medicine | Admitting: Family Medicine

## 2019-08-15 DIAGNOSIS — R52 Pain, unspecified: Secondary | ICD-10-CM

## 2019-08-15 DIAGNOSIS — J810 Acute pulmonary edema: Secondary | ICD-10-CM | POA: Diagnosis not present

## 2019-08-15 DIAGNOSIS — T402X1A Poisoning by other opioids, accidental (unintentional), initial encounter: Secondary | ICD-10-CM | POA: Diagnosis not present

## 2019-08-15 DIAGNOSIS — J9601 Acute respiratory failure with hypoxia: Secondary | ICD-10-CM | POA: Diagnosis present

## 2019-08-15 DIAGNOSIS — T50901A Poisoning by unspecified drugs, medicaments and biological substances, accidental (unintentional), initial encounter: Secondary | ICD-10-CM

## 2019-08-15 DIAGNOSIS — F1910 Other psychoactive substance abuse, uncomplicated: Secondary | ICD-10-CM

## 2019-08-15 DIAGNOSIS — J811 Chronic pulmonary edema: Secondary | ICD-10-CM

## 2019-08-15 DIAGNOSIS — T40601A Poisoning by unspecified narcotics, accidental (unintentional), initial encounter: Secondary | ICD-10-CM | POA: Diagnosis present

## 2019-08-15 DIAGNOSIS — Z20822 Contact with and (suspected) exposure to covid-19: Secondary | ICD-10-CM | POA: Diagnosis not present

## 2019-08-15 DIAGNOSIS — J189 Pneumonia, unspecified organism: Secondary | ICD-10-CM | POA: Diagnosis not present

## 2019-08-15 NOTE — ED Triage Notes (Signed)
Per EMS, pt believed she was using cocaine however it was laced w/ an opoid.  When she became non-responsive, mom gave a total of 8mg  Narcan IN.  She was also given a duo neb, 125 Solmed, 2g of mag.    She did throw up prior to EMS arrival.  Alert and oriented upon arrival and able to flow directions.  Lung sounds were absent in lower lobes.

## 2019-08-16 ENCOUNTER — Observation Stay (HOSPITAL_BASED_OUTPATIENT_CLINIC_OR_DEPARTMENT_OTHER): Payer: Medicaid Other

## 2019-08-16 ENCOUNTER — Other Ambulatory Visit: Payer: Self-pay

## 2019-08-16 ENCOUNTER — Observation Stay (HOSPITAL_COMMUNITY): Payer: Medicaid Other

## 2019-08-16 ENCOUNTER — Encounter (HOSPITAL_COMMUNITY): Payer: Self-pay | Admitting: Emergency Medicine

## 2019-08-16 ENCOUNTER — Emergency Department (HOSPITAL_COMMUNITY): Payer: Medicaid Other

## 2019-08-16 DIAGNOSIS — J9601 Acute respiratory failure with hypoxia: Secondary | ICD-10-CM | POA: Diagnosis present

## 2019-08-16 DIAGNOSIS — T50901A Poisoning by unspecified drugs, medicaments and biological substances, accidental (unintentional), initial encounter: Secondary | ICD-10-CM

## 2019-08-16 DIAGNOSIS — M79609 Pain in unspecified limb: Secondary | ICD-10-CM | POA: Diagnosis not present

## 2019-08-16 DIAGNOSIS — F1910 Other psychoactive substance abuse, uncomplicated: Secondary | ICD-10-CM | POA: Diagnosis not present

## 2019-08-16 DIAGNOSIS — R079 Chest pain, unspecified: Secondary | ICD-10-CM | POA: Diagnosis not present

## 2019-08-16 DIAGNOSIS — R609 Edema, unspecified: Secondary | ICD-10-CM

## 2019-08-16 DIAGNOSIS — J810 Acute pulmonary edema: Secondary | ICD-10-CM | POA: Diagnosis not present

## 2019-08-16 DIAGNOSIS — T40601A Poisoning by unspecified narcotics, accidental (unintentional), initial encounter: Secondary | ICD-10-CM

## 2019-08-16 LAB — COMPREHENSIVE METABOLIC PANEL
ALT: 13 U/L (ref 0–44)
AST: 27 U/L (ref 15–41)
Albumin: 3.2 g/dL — ABNORMAL LOW (ref 3.5–5.0)
Alkaline Phosphatase: 75 U/L (ref 38–126)
Anion gap: 12 (ref 5–15)
BUN: 9 mg/dL (ref 6–20)
CO2: 17 mmol/L — ABNORMAL LOW (ref 22–32)
Calcium: 8.6 mg/dL — ABNORMAL LOW (ref 8.9–10.3)
Chloride: 102 mmol/L (ref 98–111)
Creatinine, Ser: 1.16 mg/dL — ABNORMAL HIGH (ref 0.44–1.00)
GFR calc Af Amer: >60 mL/min (ref >60)
GFR calc non Af Amer: >60 mL/min (ref >60)
Glucose, Bld: 279 mg/dL — ABNORMAL HIGH (ref 70–99)
Potassium: 3.8 mmol/L (ref 3.5–5.1)
Sodium: 131 mmol/L — ABNORMAL LOW (ref 135–145)
Total Bilirubin: 0.4 mg/dL (ref 0.3–1.2)
Total Protein: 7 g/dL (ref 6.5–8.1)

## 2019-08-16 LAB — TROPONIN I (HIGH SENSITIVITY)
Troponin I (High Sensitivity): 82 ng/L — ABNORMAL HIGH (ref <18)
Troponin I (High Sensitivity): 34 ng/L — ABNORMAL HIGH (ref <18)

## 2019-08-16 LAB — CBC
HCT: 41.1 % (ref 36.0–46.0)
Hemoglobin: 12.3 g/dL (ref 12.0–15.0)
MCH: 26.7 pg (ref 26.0–34.0)
MCHC: 29.9 g/dL — ABNORMAL LOW (ref 30.0–36.0)
MCV: 89.2 fL (ref 80.0–100.0)
Platelets: 339 10*3/uL (ref 150–400)
RBC: 4.61 MIL/uL (ref 3.87–5.11)
RDW: 18.9 % — ABNORMAL HIGH (ref 11.5–15.5)
WBC: 22.6 10*3/uL — ABNORMAL HIGH (ref 4.0–10.5)
nRBC: 0 % (ref 0.0–0.2)

## 2019-08-16 LAB — I-STAT ARTERIAL BLOOD GAS, ED
Acid-base deficit: 7 mmol/L — ABNORMAL HIGH (ref 0.0–2.0)
Bicarbonate: 18.4 mmol/L — ABNORMAL LOW (ref 20.0–28.0)
Calcium, Ion: 1.17 mmol/L (ref 1.15–1.40)
HCT: 39 % (ref 36.0–46.0)
Hemoglobin: 13.3 g/dL (ref 12.0–15.0)
O2 Saturation: 95 %
Patient temperature: 98.6
Potassium: 3.6 mmol/L (ref 3.5–5.1)
Sodium: 137 mmol/L (ref 135–145)
TCO2: 20 mmol/L — ABNORMAL LOW (ref 22–32)
pCO2 arterial: 36.6 mmHg (ref 32.0–48.0)
pH, Arterial: 7.311 — ABNORMAL LOW (ref 7.350–7.450)
pO2, Arterial: 83 mmHg (ref 83.0–108.0)

## 2019-08-16 LAB — ECHOCARDIOGRAM COMPLETE
Area-P 1/2: 4.49 cm2
Calc EF: 60.6 %
S' Lateral: 3.1 cm
Single Plane A2C EF: 60.9 %
Single Plane A4C EF: 61.8 %

## 2019-08-16 LAB — D-DIMER, QUANTITATIVE: D-Dimer, Quant: 6.89 ug/mL-FEU — ABNORMAL HIGH (ref 0.00–0.50)

## 2019-08-16 LAB — SALICYLATE LEVEL: Salicylate Lvl: <7 mg/dL — ABNORMAL LOW (ref 7.0–30.0)

## 2019-08-16 LAB — ACETAMINOPHEN LEVEL: Acetaminophen (Tylenol), Serum: <10 ug/mL — ABNORMAL LOW (ref 10–30)

## 2019-08-16 LAB — SARS CORONAVIRUS 2 BY RT PCR (HOSPITAL ORDER, PERFORMED IN ~~LOC~~ HOSPITAL LAB): SARS Coronavirus 2: NEGATIVE

## 2019-08-16 LAB — LACTIC ACID, PLASMA
Lactic Acid, Venous: 3.3 mmol/L (ref 0.5–1.9)
Lactic Acid, Venous: 3 mmol/L (ref 0.5–1.9)

## 2019-08-16 LAB — CBG MONITORING, ED: Glucose-Capillary: 261 mg/dL — ABNORMAL HIGH (ref 70–99)

## 2019-08-16 LAB — I-STAT BETA HCG BLOOD, ED (MC, WL, AP ONLY): I-stat hCG, quantitative: <5 m[IU]/mL (ref <5)

## 2019-08-16 LAB — ETHANOL: Alcohol, Ethyl (B): <10 mg/dL (ref <10)

## 2019-08-16 LAB — HIV ANTIBODY (ROUTINE TESTING W REFLEX): HIV Screen 4th Generation wRfx: NONREACTIVE

## 2019-08-16 MED ORDER — ACETAMINOPHEN 325 MG PO TABS
650.0000 mg | ORAL_TABLET | Freq: Four times a day (QID) | ORAL | Status: DC | PRN
  Administered 2019-08-16 – 2019-08-17 (×3): 650 mg via ORAL
  Filled 2019-08-16 (×4): qty 2

## 2019-08-16 MED ORDER — ENOXAPARIN SODIUM 40 MG/0.4ML ~~LOC~~ SOLN
40.0000 mg | SUBCUTANEOUS | Status: DC
  Administered 2019-08-16: 40 mg via SUBCUTANEOUS
  Filled 2019-08-16: qty 0.4

## 2019-08-16 MED ORDER — LACTATED RINGERS IV BOLUS
1000.0000 mL | Freq: Once | INTRAVENOUS | Status: AC
  Administered 2019-08-16: 1000 mL via INTRAVENOUS

## 2019-08-16 MED ORDER — ONDANSETRON HCL 4 MG/2ML IJ SOLN: 4.0000 mg | Freq: Four times a day (QID) | INTRAMUSCULAR | Status: DC | PRN

## 2019-08-16 MED ORDER — ACETAMINOPHEN 650 MG RE SUPP: 650.0000 mg | Freq: Four times a day (QID) | RECTAL | Status: DC | PRN

## 2019-08-16 MED ORDER — ONDANSETRON HCL 4 MG/2ML IJ SOLN
INTRAMUSCULAR | Status: AC
  Filled 2019-08-16: qty 2

## 2019-08-16 MED ORDER — ALBUTEROL SULFATE (2.5 MG/3ML) 0.083% IN NEBU: 3.0000 mL | INHALATION_SOLUTION | Freq: Four times a day (QID) | RESPIRATORY_TRACT | Status: DC | PRN

## 2019-08-16 MED ORDER — IOHEXOL 350 MG/ML SOLN
75.0000 mL | Freq: Once | INTRAVENOUS | Status: AC | PRN
  Administered 2019-08-16: 75 mL via INTRAVENOUS

## 2019-08-16 MED ORDER — ONDANSETRON HCL 4 MG/2ML IJ SOLN
4.0000 mg | Freq: Once | INTRAMUSCULAR | Status: AC
  Administered 2019-08-16: 4 mg via INTRAVENOUS

## 2019-08-16 MED ORDER — FUROSEMIDE 10 MG/ML IJ SOLN
40.0000 mg | Freq: Once | INTRAMUSCULAR | Status: AC
  Administered 2019-08-16: 40 mg via INTRAVENOUS
  Filled 2019-08-16: qty 4

## 2019-08-16 MED ORDER — ONDANSETRON HCL 4 MG PO TABS: 4.0000 mg | ORAL_TABLET | Freq: Four times a day (QID) | ORAL | Status: DC | PRN

## 2019-08-16 MED ORDER — HYDROCOD POLST-CPM POLST ER 10-8 MG/5ML PO SUER
5.0000 mL | Freq: Two times a day (BID) | ORAL | Status: DC | PRN
  Administered 2019-08-16 (×2): 5 mL via ORAL
  Filled 2019-08-16 (×2): qty 5

## 2019-08-16 NOTE — ED Provider Notes (Signed)
Emergency Department Provider Note  I have reviewed the triage vital signs and the nursing notes.  HISTORY  Chief Complaint Drug Overdose   HPI Erica Dixon is a 29 y.o. female with a history of asthma but no other significant past medical history who presents to the emergency department today for hypoxic respiratory failure.  Patient and family state that she had snorted what she thought was cocaine but then became unresponsive.  Family gave 8 mg of Narcan intranasally and she became more alert but then also started having some emesis and family states that she aspirated.  EMS got there and her O2 saturation on room air was in the low 70s I started her on CPAP and a kilo to 76 I gave her 2125 Solu-Medrol because of her history of asthma along with some albuterol which does seem to help.  They noted she had decreased breath sounds in her lower lobes.  Brought here for further evaluation.  Rest of history is limited due to patient condition and level 5 caveat applies.   No other associated or modifying symptoms.    Past Medical History:  Diagnosis Date  . ADD (attention deficit disorder)   . Depression   . Schizoaffective disorder [facility])     Patient Active Problem List   Diagnosis Date Noted  . MDD (major depressive disorder), recurrent severe, without psychosis (HCC) 07/14/2018  . Right foot pain 05/19/2014    Past Surgical History:  Procedure Laterality Date  . WISDOM TOOTH EXTRACTION      Current Outpatient Rx  . Order #: [id number] Class: Historical Med  . Order #: [id number] Class: Print  . Order #: [id number] Class: Historical Med  . Order #: [id number] Class: Print  . Order #: [id number] Class: Print  . Order #: [id number] Class: Print  . Order #: [id number] Class: Historical Med    Allergies Patient has no known allergies.  No family history on file.  Social History Social History   Tobacco Use  . Smoking status: Current Some Day Smoker  . Smokeless tobacco: Never  Used  Substance Use Topics  . Alcohol use: Yes    Alcohol/week: 6.0 standard drinks    Types: 6 Glasses of wine per week  . Drug use: Never    Review of Systems  history is limited due to patient condition and level 5 caveat applies. ____________________________________________  PHYSICAL EXAM:  VITAL SIGNS: ED Triage Vitals  Enc Vitals Group     BP 08/16/19 0004 92/70     Pulse Rate 08/16/19 0004 (!) 140     Resp 08/16/19 0004 (!) 32     Temp --      Temp src --      SpO2 08/16/19 0004 (!) 85 %     Weight 08/16/19 0012 230 lb (104.3 kg)     Height 08/16/19 0012 5\' 7"  (1.702 m)    Constitutional: Alert and oriented. Well appearing and in no moderate respiratory distress Eyes: Conjunctivae are normal. PERRL. EOMI. Head: Atraumatic. Nose: No congestion/rhinnorhea. Mouth/Throat: Mucous membranes are dry.  Oropharynx non-erythematous. Neck: No stridor.  No meningeal signs.   Cardiovascular: tachycardic rate, regular rhythm. Good peripheral circulation. Grossly normal heart sounds.   Respiratory: Patient is very tachypneic, hypoxic to the 70s on BiPAP, accessory muscle use, diminished breath sounds throughout lung fields.   Gastrointestinal: Soft and nontender. No distention.  Musculoskeletal: No lower extremity tenderness nor edema. No gross deformities of extremities. Neurologic:  Normal speech and language. No gross focal neurologic deficits are appreciated.  Skin:  Skin is warm, dry and intact. No rash noted.  ____________________________________________   LABS (all labs ordered are listed, but only abnormal results are displayed)  Labs Reviewed  CBG MONITORING, ED - Abnormal; Notable for the following components:      Result Value   Glucose-Capillary 261 (*)    All other components within normal limits  COMPREHENSIVE METABOLIC PANEL  ETHANOL  SALICYLATE LEVEL  ACETAMINOPHEN LEVEL  CBC  DRUG SCREEN 10 W/CONF, SERUM  CBG MONITORING, ED  I-STAT BETA HCG BLOOD, ED  ([facility], [facility], [facility] ONLY)  POC SARS CORONAVIRUS 2 AG -  ED   ____________________________________________  EKG   EKG Interpretation  Date/Time:    Ventricular Rate:    PR Interval:    QRS Duration:   QT Interval:    QTC Calculation:   R Axis:     Text Interpretation:         ____________________________________________  RADIOLOGY  DG Chest Portable 1 View  Result Date: 08/16/2019 CLINICAL DATA:  Possible drug overdose EXAM: PORTABLE CHEST 1 VIEW COMPARISON:  None. FINDINGS: Limited by overlying soft tissue. Diffuse bilateral interstitial and ground-glass opacity. No pleural effusion. Normal heart size. No pneumothorax. IMPRESSION: Diffuse bilateral interstitial and ground-glass opacity, possible atypical or viral pneumonia. Pulmonary edema could also be considered though cardiac size appears normal. Electronically Signed   By: Jasmine Pang M.D.   On: 08/16/2019 00:08   ____________________________________________  PROCEDURES  Procedure(s) performed:   .Critical Care Performed by: Marily Memos, MD Authorized by: Marily Memos, MD   Critical care provider statement:    Critical care time (minutes):  45   Critical care was necessary to treat or prevent imminent or life-threatening deterioration of the following conditions:  Respiratory failure   Critical care was time spent personally by me on the following activities:  Discussions with consultants, evaluation of patient's response to treatment, examination of patient, ordering and performing treatments and interventions, ordering and review of laboratory studies, ordering and review of radiographic studies, pulse oximetry, re-evaluation of patient's condition, obtaining history from patient or surrogate and review of old charts   ____________________________________________  INITIAL IMPRESSION / ASSESSMENT AND PLAN / ED COURSE   This patient presents to the ED for concern of drug overdose with likely aspiration pneumonitis  and hypoxic respiratory failure, this involves an extensive number of treatment options, and is a complaint that carries with it a high risk of complications and morbidity.  The differential diagnosis includes pneumonia (viral versus bacterial), syncope, seizure, pulmonary edema secondary to overdose and Narcan.  Clinical Course as of Aug 16 246  Fri Aug 16, 2019  0245 Discussed with ICU via Elink, prefers to try and wean from FiO2 and attempt admission to SDU. FiO2 decreased to 85   [JM]  0246 Improving  Pulse Rate(!): 111 [JM]  0246 Getting fluids, will recheck  Lactic Acid, Venous(!!): 3.3 [JM]  0246 Likely reactive  WBC(!): 22.6 [JM]    Clinical Course User Index [JM] Andreanna Mikolajczak, Barbara Cower, MD    Medicines ordered:   I ordered medication albuterol and fluids  For wheezing/diminished breath sounds and tachycardia   Imaging Studies ordered:   I independently visualized and interpreted imaging cxr which showed signs consistent with likely pulmonary edema, suspect negative pressure from overdose/narcan.   Additional history obtained:   Additional history obtained from EMS  Previous records obtained and reviewed in Epic  Consultations Obtained:   I consulted CCM and hospitalist  and discussed lab and imaging  findings  Reevaluation:  After the interventions stated above, I reevaluated the patient and found improving oxygenation. Starting weaning oxygen. Will admit to SDU.   Critical Interventions: bipap oxygenation   A medical screening exam was performed and I feel the patient has had an appropriate workup for their chief complaint at this time and likelihood of emergent condition existing is low. They have been counseled on decision, discharge, follow up and which symptoms necessitate immediate return to the emergency department. They or their family verbally stated understanding and agreement with plan and discharged in stable condition.     ____________________________________________  FINAL CLINICAL IMPRESSION(S) / ED DIAGNOSES  Final diagnoses:  Polysubstance abuse (HCC)  Accidental drug overdose, initial encounter  Acute respiratory failure with hypoxia (HCC)    MEDICATIONS GIVEN DURING THIS VISIT:  Medications - No data to display  NEW OUTPATIENT MEDICATIONS STARTED DURING THIS VISIT:  New Prescriptions   No medications on file    Note:  This note was prepared with assistance of Dragon voice recognition software. Occasional wrong-word or sound-a-like substitutions may have occurred due to the inherent limitations of voice recognition software.   Ashlei Chinchilla, Barbara Cower, MD 08/17/19 [phone]

## 2019-08-16 NOTE — ED Notes (Signed)
Pt assisted to bedside commode.  Tolerated well.  Sats remained at 95% on 4L [place] with rr in the 20's.

## 2019-08-16 NOTE — ED Notes (Signed)
SDU Breakfast Ordered 

## 2019-08-16 NOTE — Progress Notes (Signed)
  Echocardiogram 2D Echocardiogram has been performed.  Janalyn Harder 08/16/2019, 9:47 AM

## 2019-08-16 NOTE — Progress Notes (Signed)
Lower extremity venous LT study completed.   See Cv Proc for preliminary results.   Erica Dixon  

## 2019-08-16 NOTE — ED Notes (Signed)
MD notified of abnormal labs 

## 2019-08-16 NOTE — ED Notes (Signed)
Lunch Tray Ordered @ 1043. 

## 2019-08-16 NOTE — H&P (Signed)
29 year old pleasant lady who was admitted early morning with presumed diagnosis of opioid-induced pulmonary edema.  She was on BiPAP when she arrived to the ED.  I saw patient and she was still in the ED.  She was down to 2 L_oxygen.  She was still complaining of shortness of breath but better.  She also complained of some chest pressure, 5 out of 10, nonradiating with no relieving factor but aggravates with deep breathing.  She also complained of having pain in the left ankle.  No prior history of heart attack, CAD or any blood clots.  No recent history of travel however her pulmonary edema seems to be out of proportion for just snorting street drug so I suspected possible PE or ACS.  Obtained troponin, first 1 is significantly elevated at 80 and D-dimer is significantly elevated as well.  Patient wanted x-ray of the left ankle for pain.  I had ordered that this morning.  Now with elevated D-dimer and troponin, suspicion rises further for PE and possibly left lower extremity DVT so I will order stat CT chest angiogram and left lower extremity Doppler.  Wait for second troponin.  If further elevation, will consult cardiology.  If any blood clot, will start patient on heparin drip.  On exam, patient was comfortable with no rhonchi but had diminished breath sounds at the bases with questionable faint crackles.  Her left lower extremity was edematous than the right.  She was tender to palpation on the left lower extremity along with calf tenderness.

## 2019-08-16 NOTE — ED Notes (Signed)
Pt has been weaned off bipap and non-rebreather.  Now on 4L O2

## 2019-08-16 NOTE — ED Notes (Signed)
Attempted report 

## 2019-08-16 NOTE — ED Notes (Signed)
Pt with O2 sats of 90% on RA after coming from CT where she was laying flat.  Repositioned, but sats remain 90% and pt is sob.  Pt placed on 2L [place].

## 2019-08-16 NOTE — ED Notes (Signed)
Pt reports left ankle is causing her pain. Not sure if it was injured in the episode earlier today.

## 2019-08-16 NOTE — ED Notes (Signed)
Mon called to check on the status of the daughter--Rn Victorino Dike already s/w dad--I advised mom to get info from dad per rn--Erica Dixon

## 2019-08-16 NOTE — H&P (Signed)
History and Physical    [name]:[id number] DOB: May 20, 1990 DOA: 08/15/2019  PCP: Patient, No Pcp Per  Patient coming from: Home  I have personally briefly reviewed patient's old medical records in [facility]  Chief Complaint: OD  HPI: Erica Dixon is a 29 y.o. female with medical history significant of asthma, schizoaffective disorder.  Pt and family state she snorted what she thought was cocaine, but then she rapidly became unresponsive.  Family gave pt 8mg  intranasal narcan, she became more alert, then started having some emesis, and family suspected she had aspiration.  EMS called.  O2 sat on EMS arrival low 70s on RA.  Pt put on CPAP.   ED Course: despite CPAP O2 sat 76% on arrival to ED.  Pt put on BIPAP, required nearly 100% FIO2 initially to maintain low 90s  CXR shows "Diffuse bilateral interstitial and ground-glass opacity, possible atypical or viral pneumonia. Pulmonary edema could also be considered though cardiac size appears normal."  COVID neg, WBC 22.6k  Pt with rapidly improving O2 requirement during ED stay, has now been weaned off of BIPAP to NRB, satting 100% on the NRB without respiratory distress.   Review of Systems: As per HPI, otherwise all review of systems negative.  Past Medical History:  Diagnosis Date  . ADD (attention deficit disorder)   . Depression   . Schizoaffective disorder [facility])     Past Surgical History:  Procedure Laterality Date  . WISDOM TOOTH EXTRACTION       reports that she has been smoking. She has never used smokeless tobacco. She reports current alcohol use of about 6.0 standard drinks of alcohol per week. She reports that she does not use drugs.  No Known Allergies  No family history on file.   Prior to Admission medications   Medication Sig Start Date End Date Taking? Authorizing Provider  acetaminophen (TYLENOL) 500 MG tablet Take 500-1,000 mg by mouth every 6 (six) hours as needed for mild pain or  headache.    [provider]  albuterol (VENTOLIN HFA) 108 (90 Base) MCG/ACT inhaler Inhale 2 puffs into the lungs every 6 (six) hours as needed for wheezing or shortness of breath. 07/14/18   09/14/18, PA-C  hydrOXYzine (ATARAX/VISTARIL) 50 MG tablet Take 50 mg by mouth 3 (three) times daily as needed.    [provider]  metroNIDAZOLE (FLAGYL) 500 MG tablet Take 1 tablet (500 mg total) by mouth 2 (two) times daily. One po bid x 7 days Patient not taking: Reported on 07/13/2018 06/14/14   08/14/14, MD  montelukast (SINGULAIR) 10 MG tablet Take 1 tablet (10 mg total) by mouth at bedtime for 7 days. 07/14/18 07/21/18  09/21/18, PA-C  QUEtiapine (SEROQUEL) 200 MG tablet Take 1 tablet (200 mg total) by mouth at bedtime as needed for up to 7 days ("for sleep"). 07/14/18 07/21/18  09/21/18, PA-C  tetrahydrozoline-zinc (VISINE-AC) 0.05-0.25 % ophthalmic solution Place 2 drops into both eyes 3 (three) times daily as needed (for itching).    [provider]    Physical Exam: Vitals:   08/16/19 0445 08/16/19 0500 08/16/19 0515 08/16/19 0530  BP: 98/67 98/67 99/74  106/71  Pulse: 81 79 81 95  Resp: (!) 21 (!) 24 (!) 21 (!) 22  SpO2: 100% 100% 100% 100%  Weight:      Height:        Constitutional: NAD, calm, comfortable Eyes: PERRL, lids and conjunctivae normal ENMT: Mucous membranes  are moist. Posterior pharynx clear of any exudate or lesions.Normal dentition.  Neck: normal, supple, no masses, no thyromegaly Respiratory: clear to auscultation bilaterally, no wheezing, no crackles. Normal respiratory effort. No accessory muscle use.  Cardiovascular: Regular rate and rhythm, no murmurs / rubs / gallops. No extremity edema. 2+ pedal pulses. No carotid bruits.  Abdomen: no tenderness, no masses palpated. No hepatosplenomegaly. Bowel sounds positive.  Musculoskeletal: no clubbing / cyanosis. No joint deformity upper and lower extremities. Good ROM, no  contractures. Normal muscle tone.  Skin: no rashes, lesions, ulcers. No induration Neurologic: CN 2-12 grossly intact. Sensation intact, DTR normal. Strength 5/5 in all 4.  Psychiatric: Normal judgment and insight. Alert and oriented x 3. Normal mood.    Labs on Admission: I have personally reviewed following labs and imaging studies  CBC: Recent Labs  Lab 08/16/19 0010 08/16/19 0036  WBC 22.6*  --   HGB 12.3 13.3  HCT 41.1 39.0  MCV 89.2  --   PLT 339  --    Basic Metabolic Panel: Recent Labs  Lab 08/16/19 0010 08/16/19 0036  NA 131* 137  K 3.8 3.6  CL 102  --   CO2 17*  --   GLUCOSE 279*  --   BUN 9  --   CREATININE 1.16*  --   CALCIUM 8.6*  --    GFR: Estimated Creatinine Clearance: 89.7 mL/min (A) (by C-G formula based on SCr of 1.16 mg/dL (H)). Liver Function Tests: Recent Labs  Lab 08/16/19 0010  AST 27  ALT 13  ALKPHOS 75  BILITOT 0.4  PROT 7.0  ALBUMIN 3.2*   No results for input(s): LIPASE, AMYLASE in the last 168 hours. No results for input(s): AMMONIA in the last 168 hours. Coagulation Profile: No results for input(s): INR, PROTIME in the last 168 hours. Cardiac Enzymes: No results for input(s): CKTOTAL, CKMB, CKMBINDEX, TROPONINI in the last 168 hours. BNP (last 3 results) No results for input(s): PROBNP in the last 8760 hours. HbA1C: No results for input(s): HGBA1C in the last 72 hours. CBG: Recent Labs  Lab 08/16/19 0005  GLUCAP 261*   Lipid Profile: No results for input(s): CHOL, HDL, LDLCALC, TRIG, CHOLHDL, LDLDIRECT in the last 72 hours. Thyroid Function Tests: No results for input(s): TSH, T4TOTAL, FREET4, T3FREE, THYROIDAB in the last 72 hours. Anemia Panel: No results for input(s): VITAMINB12, FOLATE, FERRITIN, TIBC, IRON, RETICCTPCT in the last 72 hours. Urine analysis:    Component Value Date/Time   COLORURINE STRAW (A) 06/14/2014 1430   APPEARANCEUR CLEAR 06/14/2014 1430   LABSPEC 1.005 06/14/2014 1430   PHURINE 7.0  06/14/2014 1430   GLUCOSEU NEGATIVE 06/14/2014 1430   HGBUR NEGATIVE 06/14/2014 1430   BILIRUBINUR NEGATIVE 06/14/2014 1430   KETONESUR NEGATIVE 06/14/2014 1430   PROTEINUR NEGATIVE 06/14/2014 1430   UROBILINOGEN 0.2 06/14/2014 1430   NITRITE NEGATIVE 06/14/2014 1430   LEUKOCYTESUR NEGATIVE 06/14/2014 1430    Radiological Exams on Admission: DG Chest Portable 1 View  Result Date: 08/16/2019 CLINICAL DATA:  Possible drug overdose EXAM: PORTABLE CHEST 1 VIEW COMPARISON:  None. FINDINGS: Limited by overlying soft tissue. Diffuse bilateral interstitial and ground-glass opacity. No pleural effusion. Normal heart size. No pneumothorax. IMPRESSION: Diffuse bilateral interstitial and ground-glass opacity, possible atypical or viral pneumonia. Pulmonary edema could also be considered though cardiac size appears normal. Electronically Signed   By: Jasmine Pang M.D.   On: 08/16/2019 00:08    EKG: Independently reviewed.  Assessment/Plan Principal Problem:   Acute noncardiogenic  pulmonary edema (HCC) Active Problems:   Overdose of opiate or related narcotic, accidental or unintentional, initial encounter (HCC)   Acute respiratory failure with hypoxia (HCC)    1. Opiate NCPE - causing acute respiratory failure with hypoxia 1. Rapidly improving O2 requirement as is frequently seen with this 2. Wean O2 as able 3. Cont pulse ox 4. Tele monitor 2. Opiate OD - 1. S/p narcan  DVT prophylaxis: Lovenox Code Status: Full Family Communication: No family in room Disposition Plan: Home after breathing improved, possibly even later today at this rate Consults called: None Admission status: Place in obs    Aycen Porreca M. DO [facility]  How to contact the [facility] Attending or Consulting provider 7A - 7P or covering provider during after hours 7P -7A, for this patient?  1. Check the care team in [facility] and look for a) attending/consulting [facility] provider listed and b) the [facility] team listed 2. Log  into [email or web address]  [employer] Physician Scheduling and messaging for groups and whole hospitals  On call and physician scheduling software for group practices, residents, hospitalists and other medical providers for call, clinic, rotation and shift schedules. [employer] is a hospital-wide system for scheduling doctors and paging doctors on call. EasyPlot is for scientific plotting and data analysis.  [email or web address]  and use [place]'s universal password to access. If you do not have the password, please contact the hospital operator.  3. Locate the [facility] provider you are looking for under [facility] and page to a number that you can be directly reached. 4. If you still have difficulty reaching the provider, please page the [facility] (Director on Call) for the Hospitalists listed on amion for assistance.  08/16/2019, 5:34 AM

## 2019-08-16 NOTE — ED Notes (Signed)
[phone], Earl Lites, father would like an update

## 2019-08-17 ENCOUNTER — Observation Stay (HOSPITAL_COMMUNITY): Payer: Medicaid Other

## 2019-08-17 DIAGNOSIS — J189 Pneumonia, unspecified organism: Secondary | ICD-10-CM

## 2019-08-17 DIAGNOSIS — J810 Acute pulmonary edema: Secondary | ICD-10-CM | POA: Diagnosis not present

## 2019-08-17 LAB — CBC WITH DIFFERENTIAL/PLATELET
Abs Immature Granulocytes: 0.07 10*3/uL (ref 0.00–0.07)
Basophils Absolute: 0 10*3/uL (ref 0.0–0.1)
Basophils Relative: 0 %
Eosinophils Absolute: 0 10*3/uL (ref 0.0–0.5)
Eosinophils Relative: 0 %
HCT: 34 % — ABNORMAL LOW (ref 36.0–46.0)
Hemoglobin: 10.3 g/dL — ABNORMAL LOW (ref 12.0–15.0)
Immature Granulocytes: 0 %
Lymphocytes Relative: 13 %
Lymphs Abs: 2.1 10*3/uL (ref 0.7–4.0)
MCH: 26.9 pg (ref 26.0–34.0)
MCHC: 30.3 g/dL (ref 30.0–36.0)
MCV: 88.8 fL (ref 80.0–100.0)
Monocytes Absolute: 0.8 10*3/uL (ref 0.1–1.0)
Monocytes Relative: 5 %
Neutro Abs: 13.1 10*3/uL — ABNORMAL HIGH (ref 1.7–7.7)
Neutrophils Relative %: 82 %
Platelets: 242 10*3/uL (ref 150–400)
RBC: 3.83 MIL/uL — ABNORMAL LOW (ref 3.87–5.11)
RDW: 19.1 % — ABNORMAL HIGH (ref 11.5–15.5)
WBC: 16 10*3/uL — ABNORMAL HIGH (ref 4.0–10.5)
nRBC: 0 % (ref 0.0–0.2)

## 2019-08-17 LAB — COMPREHENSIVE METABOLIC PANEL
ALT: 16 U/L (ref 0–44)
AST: 22 U/L (ref 15–41)
Albumin: 2.8 g/dL — ABNORMAL LOW (ref 3.5–5.0)
Alkaline Phosphatase: 52 U/L (ref 38–126)
Anion gap: 10 (ref 5–15)
BUN: 6 mg/dL (ref 6–20)
CO2: 22 mmol/L (ref 22–32)
Calcium: 8.9 mg/dL (ref 8.9–10.3)
Chloride: 102 mmol/L (ref 98–111)
Creatinine, Ser: 0.93 mg/dL (ref 0.44–1.00)
GFR calc Af Amer: >60 mL/min (ref >60)
GFR calc non Af Amer: >60 mL/min (ref >60)
Glucose, Bld: 105 mg/dL — ABNORMAL HIGH (ref 70–99)
Potassium: 4.7 mmol/L (ref 3.5–5.1)
Sodium: 134 mmol/L — ABNORMAL LOW (ref 135–145)
Total Bilirubin: 0.4 mg/dL (ref 0.3–1.2)
Total Protein: 6.3 g/dL — ABNORMAL LOW (ref 6.5–8.1)

## 2019-08-17 LAB — LACTIC ACID, PLASMA
Lactic Acid, Venous: 2.4 mmol/L (ref 0.5–1.9)
Lactic Acid, Venous: 1.8 mmol/L (ref 0.5–1.9)

## 2019-08-17 LAB — PROCALCITONIN: Procalcitonin: 8.43 ng/mL

## 2019-08-17 LAB — MAGNESIUM: Magnesium: 2.1 mg/dL (ref 1.7–2.4)

## 2019-08-17 LAB — TROPONIN I (HIGH SENSITIVITY): Troponin I (High Sensitivity): 17 ng/L (ref <18)

## 2019-08-17 MED ORDER — AMOXICILLIN-POT CLAVULANATE 875-125 MG PO TABS: 1.0000 | ORAL_TABLET | Freq: Two times a day (BID) | ORAL | 0 refills | Status: AC

## 2019-08-17 MED ORDER — PREDNISONE 20 MG PO TABS: 40.0000 mg | ORAL_TABLET | Freq: Every day | ORAL | 0 refills | Status: AC

## 2019-08-17 MED ORDER — AMOXICILLIN-POT CLAVULANATE 875-125 MG PO TABS: 1.0000 | ORAL_TABLET | Freq: Two times a day (BID) | ORAL | Status: DC

## 2019-08-17 MED ORDER — MONTELUKAST SODIUM 10 MG PO TABS: 10.0000 mg | ORAL_TABLET | Freq: Every day | ORAL | 0 refills | Status: AC

## 2019-08-17 MED ORDER — DICLOFENAC SODIUM 1 % EX GEL
2.0000 g | Freq: Four times a day (QID) | CUTANEOUS | Status: DC
  Administered 2019-08-17: 2 g via TOPICAL
  Filled 2019-08-17: qty 100

## 2019-08-17 MED ORDER — FUROSEMIDE 10 MG/ML IJ SOLN
40.0000 mg | Freq: Once | INTRAMUSCULAR | Status: AC
  Administered 2019-08-17: 40 mg via INTRAVENOUS
  Filled 2019-08-17: qty 4

## 2019-08-17 MED ORDER — PREDNISONE 20 MG PO TABS: 40.0000 mg | ORAL_TABLET | Freq: Every day | ORAL | Status: DC

## 2019-08-17 MED ORDER — AMOXICILLIN-POT CLAVULANATE 875-125 MG PO TABS
1.0000 | ORAL_TABLET | Freq: Two times a day (BID) | ORAL | Status: DC
  Administered 2019-08-17: 1 via ORAL
  Filled 2019-08-17: qty 1

## 2019-08-17 NOTE — Discharge Summary (Signed)
Physician Discharge Summary  Erica Dixon WGN:[id number] DOB: [birth year] DOA: 08/15/2019  PCP: Patient, No Pcp Per  Admit date: 08/15/2019 Discharge date: 08/17/2019  Admitted From: Home Disposition: Home  Recommendations for Outpatient Follow-up:  1. Follow up with PCP in 1-2 weeks 2. Follow with pulmonary in 2 weeks 3. Please obtain BMP/CBC in one week 4. Please follow up with your PCP on the following pending results: Unresulted Labs (From admission, onward) Comment          Start     Ordered   08/17/19 0800  Lactic acid, plasma  STAT Now then every 3 hours,   R (with STAT occurrences)      08/17/19 0759   08/16/19 0026  Blood gas, arterial  Once,   STAT        08/16/19 0025   08/16/19 0011  Drug Screen 10 W/Conf, Serum  Once,   STAT        08/16/19 0011           Home Health: None Equipment/Devices: None  Discharge Condition: Stable CODE STATUS: Full code Diet recommendation: Cardiac  Subjective: Seen and examined.  Feels much better.  No shortness of breath.  Erica Dixon is a 29 y.o. female with medical history significant of asthma, schizoaffective disorder.  Pt and family state she snorted what she thought was cocaine, but then she rapidly became unresponsive.  Family gave pt  intranasal narcan, she became more alert, then started having some emesis, and family suspected she had aspiration.  EMS called.  O2 sat on EMS arrival low 70s on RA.  Pt put on CPAP.   ED Course: despite CPAP O2 sat 76% on arrival to ED.  Pt put on BIPAP, required nearly 100% FIO2 initially to maintain low 90s  CXR shows "Diffuse bilateral interstitial and ground-glass opacity, possible atypical or viral pneumonia. Pulmonary edema could also be considered though cardiac size appears normal."  COVID neg, WBC 22.6k  Pt with rapidly improving O2 requirement during ED stay, has now been weaned off of BIPAP to NRB, satting 100% on the NRB without respiratory  distress.  Brief/Interim Summary: Patient was admitted with acute pulmonary edema which was presumed to be noncardiogenic and acute hypoxic respiratory failure.  She was on BiPAP and soon was tapered down to nasal can oxygen.  She was then complaining of some shortness of breath and chest pain yesterday.  Cardiac enzymes were checked, initially her troponin was slightly elevated up to 80 which were followed up and became normal within a day.  Transthoracic echo was obtained which showed normal ejection fraction with no wall motion abnormality.  Due to pleuritic chest pain complaint and elevated troponin and hypoxia and elevated D-dimer, CT angiogram of the chest was done which ruled out PE as well as Doppler lower extremity on the left ruled out DVT.  Patient was given some diuretic.  She also came in with mild lactic acidosis which was likely secondary to whatever she inhaled.  Her elevated troponins were likely secondary to demand ischemia due to having acute hypoxic respiratory failure.  She also came in with leukocytosis which improved from 22 yesterday to 16 today.  Repeat chest x-ray shows improvement but still shows multifocal groundglass opacities.  She was never started on antibiotic however she improved clinically and remained afebrile.  Due to persistent groundglass opacities, PCCM was consulted and they recommended discharging her home today on oral Augmentin for 7 days as well as prednisone 40 mg p.o.  for 7 days for possible aspiration pneumonia for what ever she inhaled.  She can follow-up with them in 1 to 2 weeks if she does not improve.  Patient is feeling much better.  Her oxygen saturation at rest currently is 96% on room air.  Her ambulatory oximetry will be checked and as long as she stays over 90%, she will be discharged home.  Discharge Diagnoses:  Principal Problem:   Acute noncardiogenic pulmonary edema (HCC) Active Problems:   Overdose of opiate or related narcotic, accidental or  unintentional, initial encounter (HCC)   Acute respiratory failure with hypoxia (HCC)   Multifocal pneumonia    Discharge Instructions   Allergies as of 08/17/2019   No Known Allergies     Medication List    STOP taking these medications   metroNIDAZOLE 500 MG tablet Commonly known as: Flagyl     TAKE these medications   acetaminophen 500 MG tablet Commonly known as: TYLENOL Take 500-1,000 mg by mouth every 6 (six) hours as needed for mild pain or headache.   albuterol 108 (90 Base) MCG/ACT inhaler Commonly known as: Ventolin HFA Inhale 2 puffs into the lungs every 6 (six) hours as needed for wheezing or shortness of breath.   amoxicillin-clavulanate 875-125 MG tablet Commonly known as: AUGMENTIN Take 1 tablet by mouth every 12 (twelve) hours for 7 days.   hydrocortisone cream 1 % Apply 1 application topically daily as needed for itching.   montelukast 10 MG tablet Commonly known as: SINGULAIR Take 1 tablet (10 mg total) by mouth at bedtime for 7 days.   predniSONE 20 MG tablet Commonly known as: DELTASONE Take 2 tablets (40 mg total) by mouth daily with breakfast for 7 days. Start taking on: August 18, 2019   QUEtiapine 200 MG tablet Commonly known as: SEROQUEL Take 1 tablet (200 mg total) by mouth at bedtime as needed for up to 7 days ("for sleep").       Follow-up Information    Icard, Bradley L, DO Follow up in 2 week(s).   Specialty: Pulmonary Disease Why: Call and make appointment only if your breathing does not improve. Contact information: [address] [place] [address] [phone]              No Known Allergies  Consultations: PCCM   Procedures/Studies: DG Ankle 2 Views Left  Result Date: 08/16/2019 CLINICAL DATA:  Left ankle pain. EXAM: LEFT ANKLE - 2 VIEW COMPARISON:  No prior. FINDINGS: Mild soft tissue swelling. No acute bony or joint abnormality. No evidence of fracture dislocation. IMPRESSION: Mild soft tissue  swelling.  No acute bony abnormality. Electronically Signed   By: Maisie Fus  Register   On: 08/16/2019 10:51   CT ANGIO CHEST PE W OR WO CONTRAST  Result Date: 08/16/2019 CLINICAL DATA:  Chest pain, shortness of breath EXAM: CT ANGIOGRAPHY CHEST WITH CONTRAST TECHNIQUE: Multidetector CT imaging of the chest was performed using the standard protocol during bolus administration of intravenous contrast. Multiplanar CT image reconstructions and MIPs were obtained to evaluate the vascular anatomy. CONTRAST:  48mL OMNIPAQUE IOHEXOL 350 MG/ML SOLN COMPARISON:  None. FINDINGS: Cardiovascular: Satisfactory opacification of the pulmonary arteries to the segmental level. No evidence of pulmonary embolism. Normal heart size. No pericardial effusion. Mediastinum/Nodes: No enlarged mediastinal, hilar, or axillary lymph nodes. Thyroid gland, trachea, and esophagus demonstrate no significant findings. Lungs/Pleura: Marked consolidative changes in a peribronchovascular distribution most confluent in the bilateral hilar regions and tracking along the lower lobe bronchi  into the medial aspects of the lower lungs. Additional multifocal patchy areas of ground-glass attenuation airspace opacity extend out into the more peripheral peribronchovascular distribution. There is relative sparing of the pulmonary periphery. No evidence of pulmonary edema, pleural effusion or pneumothorax. Upper Abdomen: No acute abnormality. Incidental note is made of a replaced left hepatic artery. Musculoskeletal: No chest wall abnormality. No acute or significant osseous findings. Review of the MIP images confirms the above findings. IMPRESSION: Imaging findings are most consistent with a multifocal infectious/inflammatory process involving all lobes of both lungs. The disease process follows a peribronchovascular distribution and is most consistent with multilobar bronchopneumonia. Atypical or viral (COVID) pneumonia is also possible but considered less  likely. Electronically Signed   By: Malachy Moan M.D.   On: 08/16/2019 13:17   DG CHEST PORT 1 VIEW  Result Date: 08/17/2019 CLINICAL DATA:  Viral pneumonia EXAM: PORTABLE CHEST 1 VIEW COMPARISON:  CT chest dated 08/16/2019 FINDINGS: Mild patchy opacities in the right mid lung, medial right lower lobe and retrocardiac region, compatible with multifocal pneumonia. Additional faint ground-glass opacities are better visualized on recent CT. No pleural effusion or pneumothorax. The heart is normal in size. IMPRESSION: Multifocal pneumonia, right lung predominant, better visualized on recent CT. Electronically Signed   By: Charline Bills M.D.   On: 08/17/2019 08:49   DG Chest Portable 1 View  Result Date: 08/16/2019 CLINICAL DATA:  Possible drug overdose EXAM: PORTABLE CHEST 1 VIEW COMPARISON:  None. FINDINGS: Limited by overlying soft tissue. Diffuse bilateral interstitial and ground-glass opacity. No pleural effusion. Normal heart size. No pneumothorax. IMPRESSION: Diffuse bilateral interstitial and ground-glass opacity, possible atypical or viral pneumonia. Pulmonary edema could also be considered though cardiac size appears normal. Electronically Signed   By: Jasmine Pang M.D.   On: 08/16/2019 00:08   ECHOCARDIOGRAM COMPLETE  Result Date: 08/16/2019    ECHOCARDIOGRAM REPORT   Patient Name:   ALLESHA ARONOFF Date of Exam: 08/16/2019 Medical Rec #:  [id number]       Height:       67.0 in Accession #:    [id number]      Weight:       230.0 lb Date of Birth:  [birth year]       BSA:          2.146 m Patient Age:    29 years        BP:           91/63 mmHg Patient Gender: F               HR:           75 bpm. Exam Location:  Inpatient Procedure: 2D Echo, 3D Echo, Cardiac Doppler, Color Doppler and Strain Analysis Indications:    R07.9* Chest pain, unspecified  History:        Patient has no prior history of Echocardiogram examinations.                 Signs/Symptoms:Chest Pain, Dyspnea and Syncope. Overdose.                  Pulmonary edema. Hypoxia. Cocaine use.  Sonographer:    Sheralyn Boatman RDCS Referring Phys: [id number] [facility]  Sonographer Comments: Technically difficult study due to poor echo windows. Image acquisition challenging due to patient body habitus. IMPRESSIONS  1. Left ventricular ejection fraction, by estimation, is 60 to 65%. The left ventricle has normal function. The left ventricle has no regional wall motion abnormalities.  Left ventricular diastolic parameters were normal.  2. Right ventricular systolic function is normal. The right ventricular size is normal.  3. The mitral valve is normal in structure. Trivial mitral valve regurgitation. No evidence of mitral stenosis.  4. The aortic valve is tricuspid. Aortic valve regurgitation is not visualized. No aortic stenosis is present.  5. The inferior vena cava is normal in size with greater than 50% respiratory variability, suggesting right atrial pressure of 3 mmHg. FINDINGS  Left Ventricle: Left ventricular ejection fraction, by estimation, is 60 to 65%. The left ventricle has normal function. The left ventricle has no regional wall motion abnormalities. The left ventricular internal cavity size was normal in size. There is  no left ventricular hypertrophy. Left ventricular diastolic parameters were normal. Right Ventricle: The right ventricular size is normal.Right ventricular systolic function is normal. Left Atrium: Left atrial size was normal in size. Right Atrium: Right atrial size was normal in size. Pericardium: There is no evidence of pericardial effusion. Mitral Valve: The mitral valve is normal in structure. Normal mobility of the mitral valve leaflets. Trivial mitral valve regurgitation. No evidence of mitral valve stenosis. Tricuspid Valve: The tricuspid valve is normal in structure. Tricuspid valve regurgitation is trivial. No evidence of tricuspid stenosis. Aortic Valve: The aortic valve is tricuspid. Aortic valve regurgitation is not  visualized. No aortic stenosis is present. Pulmonic Valve: The pulmonic valve was normal in structure. Pulmonic valve regurgitation is trivial. No evidence of pulmonic stenosis. Aorta: The aortic root is normal in size and structure. Venous: The inferior vena cava is normal in size with greater than 50% respiratory variability, suggesting right atrial pressure of 3 mmHg. IAS/Shunts: No atrial level shunt detected by color flow Doppler.  LEFT VENTRICLE PLAX 2D LVIDd:         4.60 cm     Diastology LVIDs:         3.10 cm     LV e' lateral:   14.60 cm/s LV PW:         1.20 cm     LV E/e' lateral: 5.0 LV IVS:        1.10 cm     LV e' medial:    11.00 cm/s LVOT diam:     2.00 cm     LV E/e' medial:  6.7 LV SV:         76 LV SV Index:   35 LVOT Area:     3.14 cm  LV Volumes (MOD) LV vol d, MOD A2C: 99.5 ml LV vol d, MOD A4C: 88.2 ml LV vol s, MOD A2C: 38.9 ml LV vol s, MOD A4C: 33.7 ml LV SV MOD A2C:     60.6 ml LV SV MOD A4C:     88.2 ml LV SV MOD BP:      57.5 ml RIGHT VENTRICLE             IVC RV S prime:     12.30 cm/s  IVC diam: 0.80 cm TAPSE (M-mode): 2.8 cm LEFT ATRIUM             Index       RIGHT ATRIUM           Index LA diam:        3.30 cm 1.54 cm/m  RA Area:     10.80 cm LA Vol (A2C):   54.1 ml 25.21 ml/m RA Volume:   21.00 ml  9.79 ml/m LA Vol (A4C):   38.1 ml  17.75 ml/m LA Biplane Vol: 47.1 ml 21.95 ml/m  AORTIC VALVE             PULMONIC VALVE LVOT Vmax:   118.00 cm/s PR End Diast Vel: 1.47 msec LVOT Vmean:  87.800 cm/s LVOT VTI:    0.241 m  AORTA Ao Root diam: 3.00 cm Ao Asc diam:  2.90 cm MITRAL VALVE MV Area (PHT): 4.49 cm    SHUNTS MV Decel Time: 169 msec    Systemic VTI:  0.24 m MV E velocity: 73.50 cm/s  Systemic Diam: 2.00 cm MV A velocity: 52.30 cm/s MV E/A ratio:  1.41 Olga Millers MD Electronically signed by Olga Millers MD Signature Date/Time: 08/16/2019/12:12:54 PM    Final    VAS [place] LOWER EXTREMITY VENOUS (DVT)  Result Date: 08/17/2019  Lower Venous DVTStudy Indications: Pain, and  Edema.  Comparison Study: No prior studies. Performing Technologist: Jean Rosenthal  Examination Guidelines: A complete evaluation includes B-mode imaging, spectral Doppler, color Doppler, and power Doppler as needed of all accessible portions of each vessel. Bilateral testing is considered an integral part of a complete examination. Limited examinations for reoccurring indications may be performed as noted. The reflux portion of the exam is performed with the patient in reverse Trendelenburg.  +-----+---------------+---------+-----------+----------+--------------+ RIGHTCompressibilityPhasicitySpontaneityPropertiesThrombus Aging +-----+---------------+---------+-----------+----------+--------------+ CFV  Full           Yes      Yes                                 +-----+---------------+---------+-----------+----------+--------------+   +---------+---------------+---------+-----------+----------+--------------+ LEFT     CompressibilityPhasicitySpontaneityPropertiesThrombus Aging +---------+---------------+---------+-----------+----------+--------------+ CFV      Full           Yes      Yes                                 +---------+---------------+---------+-----------+----------+--------------+ SFJ      Full                                                        +---------+---------------+---------+-----------+----------+--------------+ FV Prox  Full                                                        +---------+---------------+---------+-----------+----------+--------------+ FV Mid   Full                                                        +---------+---------------+---------+-----------+----------+--------------+ FV DistalFull                                                        +---------+---------------+---------+-----------+----------+--------------+ PFV      Full                                                         +---------+---------------+---------+-----------+----------+--------------+  POP      Full           Yes      Yes                                 +---------+---------------+---------+-----------+----------+--------------+ PTV      Full                                                        +---------+---------------+---------+-----------+----------+--------------+ PERO     Full                                                        +---------+---------------+---------+-----------+----------+--------------+     Summary: RIGHT: - No evidence of common femoral vein obstruction.  LEFT: - There is no evidence of deep vein thrombosis in the lower extremity.  - No cystic structure found in the popliteal fossa.  *See table(s) above for measurements and observations. Electronically signed by Coral Else MD on 08/17/2019 at 1:17:07 AM.    Final       Discharge Exam: Vitals:   08/16/19 2310 08/17/19 0359  BP: 105/73 111/74  Pulse: 87 85  Resp: 19 14  Temp: 98.3 F (36.8 C) 98.2 F (36.8 C)  SpO2: 99% 99%   Vitals:   08/16/19 1630 08/16/19 1935 08/16/19 2310 08/17/19 0359  BP: 113/77 109/78 105/73 111/74  Pulse:  81 87 85  Resp:  18 19 14   Temp: 98.3 F (36.8 C) 98 F (36.7 C) 98.3 F (36.8 C) 98.2 F (36.8 C)  TempSrc: Oral Oral Oral Oral  SpO2:  100% 99% 99%  Weight:      Height:        General: Pt is alert, awake, not in acute distress Cardiovascular: RRR, S1/S2 +, no rubs, no gallops Respiratory: CTA bilaterally, no wheezing, no rhonchi Abdominal: Soft, NT, ND, bowel sounds + Extremities: no edema, no cyanosis    The results of significant diagnostics from this hospitalization (including imaging, microbiology, ancillary and laboratory) are listed below for reference.     Microbiology: Recent Results (from the past 240 hour(s))  SARS Coronavirus 2 by RT PCR (hospital order, performed in [facility] hospital lab) Nasopharyngeal Nasopharyngeal Swab     Status:  None   Collection Time: 08/16/19  2:30 AM   Specimen: Nasopharyngeal Swab  Result Value Ref Range Status   SARS Coronavirus 2 NEGATIVE NEGATIVE Final    Comment: (NOTE) SARS-CoV-2 target nucleic acids are NOT DETECTED.  The SARS-CoV-2 RNA is generally detectable in upper and lower respiratory specimens during the acute phase of infection. The lowest concentration of SARS-CoV-2 viral copies this assay can detect is 250 copies / mL. A negative result does not preclude SARS-CoV-2 infection and should not be used as the sole basis for treatment or other patient management decisions.  A negative result may occur with improper specimen collection / handling, submission of specimen other than nasopharyngeal swab, presence of viral mutation(s) within the areas targeted by this assay, and inadequate number of viral copies (<250 copies / mL). A negative result must be combined  with clinical observations, patient history, and epidemiological information.  Fact Sheet for Patients:   BoilerBrush.com.cy  Fact Sheet for Healthcare Providers: https://pope.com/  This test is not yet approved or  cleared by the [place] FDA and has been authorized for detection and/or diagnosis of SARS-CoV-2 by FDA under an Emergency Use Authorization (EUA).  This EUA will remain in effect (meaning this test can be used) for the duration of the COVID-19 declaration under Section 564(b)(1) of the Act, 21 U.S.C. section 360bbb-3(b)(1), unless the authorization is terminated or revoked sooner.  Performed at [facility], [address], [place], [place] [address]      Labs: BNP (last 3 results) No results for input(s): BNP in the last 8760 hours. Basic Metabolic Panel: Recent Labs  Lab 08/16/19 0010 08/16/19 0036 08/17/19 0826  NA 131* 137 134*  K 3.8 3.6 4.7  CL 102  --  102  CO2 17*  --  22  GLUCOSE 279*  --  105*  BUN 9  --  6  CREATININE  1.16*  --  0.93  CALCIUM 8.6*  --  8.9  MG  --   --  2.1   Liver Function Tests: Recent Labs  Lab 08/16/19 0010 08/17/19 0826  AST 27 22  ALT 13 16  ALKPHOS 75 52  BILITOT 0.4 0.4  PROT 7.0 6.3*  ALBUMIN 3.2* 2.8*   No results for input(s): LIPASE, AMYLASE in the last 168 hours. No results for input(s): AMMONIA in the last 168 hours. CBC: Recent Labs  Lab 08/16/19 0010 08/16/19 0036 08/17/19 0826  WBC 22.6*  --  16.0*  NEUTROABS  --   --  13.1*  HGB 12.3 13.3 10.3*  HCT 41.1 39.0 34.0*  MCV 89.2  --  88.8  PLT 339  --  242   Cardiac Enzymes: No results for input(s): CKTOTAL, CKMB, CKMBINDEX, TROPONINI in the last 168 hours. BNP: Invalid input(s): POCBNP CBG: Recent Labs  Lab 08/16/19 0005  GLUCAP 261*   D-Dimer Recent Labs    08/16/19 0957  DDIMER 6.89*   Hgb A1c No results for input(s): HGBA1C in the last 72 hours. Lipid Profile No results for input(s): CHOL, HDL, LDLCALC, TRIG, CHOLHDL, LDLDIRECT in the last 72 hours. Thyroid function studies No results for input(s): TSH, T4TOTAL, T3FREE, THYROIDAB in the last 72 hours.  Invalid input(s): FREET3 Anemia work up No results for input(s): VITAMINB12, FOLATE, FERRITIN, TIBC, IRON, RETICCTPCT in the last 72 hours. Urinalysis    Component Value Date/Time   COLORURINE STRAW (A) 06/14/2014 1430   APPEARANCEUR CLEAR 06/14/2014 1430   LABSPEC 1.005 06/14/2014 1430   PHURINE 7.0 06/14/2014 1430   GLUCOSEU NEGATIVE 06/14/2014 1430   HGBUR NEGATIVE 06/14/2014 1430   BILIRUBINUR NEGATIVE 06/14/2014 1430   KETONESUR NEGATIVE 06/14/2014 1430   PROTEINUR NEGATIVE 06/14/2014 1430   UROBILINOGEN 0.2 06/14/2014 1430   NITRITE NEGATIVE 06/14/2014 1430   LEUKOCYTESUR NEGATIVE 06/14/2014 1430   Sepsis Labs Invalid input(s): PROCALCITONIN,  WBC,  LACTICIDVEN Microbiology Recent Results (from the past 240 hour(s))  SARS Coronavirus 2 by RT PCR (hospital order, performed in [facility] hospital lab) Nasopharyngeal  Nasopharyngeal Swab     Status: None   Collection Time: 08/16/19  2:30 AM   Specimen: Nasopharyngeal Swab  Result Value Ref Range Status   SARS Coronavirus 2 NEGATIVE NEGATIVE Final    Comment: (NOTE) SARS-CoV-2 target nucleic acids are NOT DETECTED.  The SARS-CoV-2 RNA is generally detectable in upper and  lower respiratory specimens during the acute phase of infection. The lowest concentration of SARS-CoV-2 viral copies this assay can detect is 250 copies / mL. A negative result does not preclude SARS-CoV-2 infection and should not be used as the sole basis for treatment or other patient management decisions.  A negative result may occur with improper specimen collection / handling, submission of specimen other than nasopharyngeal swab, presence of viral mutation(s) within the areas targeted by this assay, and inadequate number of viral copies (<250 copies / mL). A negative result must be combined with clinical observations, patient history, and epidemiological information.  Fact Sheet for Patients:   BoilerBrush.com.cy  Fact Sheet for Healthcare Providers: https://pope.com/  This test is not yet approved or  cleared by the [place] FDA and has been authorized for detection and/or diagnosis of SARS-CoV-2 by FDA under an Emergency Use Authorization (EUA).  This EUA will remain in effect (meaning this test can be used) for the duration of the COVID-19 declaration under Section 564(b)(1) of the Act, 21 U.S.C. section 360bbb-3(b)(1), unless the authorization is terminated or revoked sooner.  Performed at [facility], [address], [place], [place] [address]      Time coordinating discharge: Over 30 minutes  SIGNED:   Hughie Closs, MD  [facility] 08/17/2019, 11:07 AM  If 7PM-7AM, please contact night-coverage [email or web address]

## 2019-08-17 NOTE — Discharge Instructions (Signed)
Acute Respiratory Failure, Adult  Acute respiratory failure occurs when there is not enough oxygen passing from your lungs to your body. When this happens, your lungs have trouble removing carbon dioxide from the blood. This causes your blood oxygen level to drop too low as carbon dioxide builds up. Acute respiratory failure is a medical emergency. It can develop quickly, but it is temporary if treated promptly. Your lung capacity, or how much air your lungs can hold, may improve with time, exercise, and treatment. What are the causes? There are many possible causes of acute respiratory failure, including:  Lung injury.  Chest injury or damage to the ribs or tissues near the lungs.  Lung conditions that affect the flow of air and blood into and out of the lungs, such as pneumonia, acute respiratory distress syndrome, and cystic fibrosis.  Medical conditions, such as strokes or spinal cord injuries, that affect the muscles and nerves that control breathing.  Blood infection (sepsis).  Inflammation of the pancreas (pancreatitis).  A blood clot in the lungs (pulmonary embolism).  A large-volume blood transfusion.  Burns.  Near-drowning.  Seizure.  Smoke inhalation.  Reaction to medicines.  Alcohol or drug overdose. What increases the risk? This condition is more likely to develop in people who have:  A blocked airway.  Asthma.  A condition or disease that damages or weakens the muscles, nerves, bones, or tissues that are involved in breathing.  A serious infection.  A health problem that blocks the unconscious reflex that is involved in breathing, such as hypothyroidism or sleep apnea.  A lung injury or trauma. What are the signs or symptoms? Trouble breathing is the main symptom of acute respiratory failure. Symptoms may also include:  Rapid breathing.  Restlessness or anxiety.  Skin, lips, or fingernails that appear blue (cyanosis).  Rapid heart  rate.  Abnormal heart rhythms (arrhythmias).  Confusion or changes in behavior.  Tiredness or loss of energy.  Feeling sleepy or having a loss of consciousness. How is this diagnosed? Your health care provider can diagnose acute respiratory failure with a medical history and physical exam. During the exam, your health care provider will listen to your heart and check for crackling or wheezing sounds in your lungs. Your may also have tests to confirm the diagnosis and determine what is causing respiratory failure. These tests may include:  Measuring the amount of oxygen in your blood (pulse oximetry). The measurement comes from a small device that is placed on your finger, earlobe, or toe.  Other blood tests to measure blood gases and to look for signs of infection.  Sampling your cerebral spinal fluid or tracheal fluid to check for infections.  Chest X-ray to look for fluid in spaces that should be filled with air.  Electrocardiogram (ECG) to look at the heart's electrical activity. How is this treated? Treatment for this condition usually takes places in a hospital intensive care unit (ICU). Treatment depends on what is causing the condition. It may include one or more treatments until your symptoms improve. Treatment may include:  Supplemental oxygen. Extra oxygen is given through a tube in the nose, a face mask, or a hood.  A device such as a continuous positive airway pressure (CPAP) or bi-level positive airway pressure (BiPAP or BPAP) machine. This treatment uses mild air pressure to keep the airways open. A mask or other device will be placed over your nose or mouth. A tube that is connected to a motor will deliver oxygen through the   mask.  Ventilator. This treatment helps move air into and out of the lungs. This may be done with a bag and mask or a machine. For this treatment, a tube is placed in your windpipe (trachea) so air and oxygen can flow to the lungs.  Extracorporeal  membrane oxygenation (ECMO). This treatment temporarily takes over the function of the heart and lungs, supplying oxygen and removing carbon dioxide. ECMO gives the lungs a chance to recover. It may be used if a ventilator is not effective.  Tracheostomy. This is a procedure that creates a hole in the neck to insert a breathing tube.  Receiving fluids and medicines.  Rocking the bed to help breathing. Follow these instructions at home:  Take over-the-counter and prescription medicines only as told by your health care provider.  Return to normal activities as told by your health care provider. Ask your health care provider what activities are safe for you.  Keep all follow-up visits as told by your health care provider. This is important. How is this prevented? Treating infections and medical conditions that may lead to acute respiratory failure can help prevent the condition from developing. Contact a health care provider if:  You have a fever.  Your symptoms do not improve or they get worse. Get help right away if:  You are having trouble breathing.  You lose consciousness.  Your have cyanosis or turn blue.  You develop a rapid heart rate.  You are confused. These symptoms may represent a serious problem that is an emergency. Do not wait to see if the symptoms will go away. Get medical help right away. Call your local emergency services (911 in the [place] Do not drive yourself to the hospital. This information is not intended to replace advice given to you by your health care provider. Make sure you discuss any questions you have with your health care provider. Document Revised: 12/09/2016 Document Reviewed: 07/15/2015 Elsevier Patient Education  2020 [employer]  

## 2019-08-17 NOTE — Consult Note (Signed)
NAME:  Storm Dulski, MRN:  [id number], DOB:  1990-10-02, LOS: 0 ADMISSION DATE:  08/15/2019, CONSULTATION DATE:  08/17/19 REFERRING MD:  Jacqulyn Bath, CHIEF COMPLAINT:  SOB  Brief History   29 year old woman who snorted something she thought was cocaine but then passed out and woke up SOB.  CT chest abnormal, PCCM consulted.  History of present illness   29 year old woman with hx of smoking, schizoaffective disorder, substance abuse presented to ER with acute hypoxemia.  This is in setting of snorting what she thought was cocaine.  Shortly after snorting this substance she passed out.  Woke up when EMS gave her narcan but then she vomited.  Workup revealed elevated Pct, D dimer, went for CTA showing abnormalities for which PCCM consulted.  Patient smokes 5-6 cigarettes a day.  History of chronic bronchitis worse as child.  Currently coughing up green sputum but overall greatly improving from yesterday.  Past Medical History   Past Medical History:  Diagnosis Date  . ADD (attention deficit disorder)   . Depression   . Schizoaffective disorder (HCC)      Significant Hospital Events   8/6 admitted  Consults:  n/a  Procedures:  n/a  Significant Diagnostic Tests:  COVID neg CTA chest peribronchial infiltrates c/w inhalational injury or aspiration  Micro Data:  n/a  Antimicrobials:  none   Interim history/subjective:  consulted  Objective   Blood pressure 111/74, pulse 85, temperature 98.2 F (36.8 C), temperature source Oral, resp. rate 14, height 5\' 7"  (1.702 m), weight 104.3 kg, SpO2 99 %.        Intake/Output Summary (Last 24 hours) at 08/17/2019 1058 Last data filed at 08/17/2019 11/07/2019 Gross per 24 hour  Intake 1122 ml  Output 2650 ml  Net -1528 ml   Filed Weights   08/16/19 0012  Weight: 104.3 kg    Examination: General: no acute distress HENT: mmm, trachea midline Lungs: occasional crackles otherwise clear Cardiovascular: rrr, ext warm Abdomen: soft,  +bs Extremities: no edema Neuro: moves all 4 ext to command Psych: AOx3 good insight  Resolved Hospital Problem list   n/a  Assessment & Plan:  Abnormal CT chest- c/w aspiration vs. Inflammatory reaction to whatever she inhaled.   - Augmentin x 1 week, 1 week prednisone 40mg  daily - Cough syrup if it helps her - Have her walk around on RA prior to DC - Substance abuse and smoking cessation counseling given - She can f/u if breathing fails to improve, will leave clinic number in dc paperwork   Labs   CBC: Recent Labs  Lab 08/16/19 0010 08/16/19 0036 08/17/19 0826  WBC 22.6*  --  16.0*  NEUTROABS  --   --  13.1*  HGB 12.3 13.3 10.3*  HCT 41.1 39.0 34.0*  MCV 89.2  --  88.8  PLT 339  --  242    Basic Metabolic Panel: Recent Labs  Lab 08/16/19 0010 08/16/19 0036 08/17/19 0826  NA 131* 137 134*  K 3.8 3.6 4.7  CL 102  --  102  CO2 17*  --  22  GLUCOSE 279*  --  105*  BUN 9  --  6  CREATININE 1.16*  --  0.93  CALCIUM 8.6*  --  8.9  MG  --   --  2.1   GFR: Estimated Creatinine Clearance: 111.9 mL/min (by C-G formula based on SCr of 0.93 mg/dL). Recent Labs  Lab 08/16/19 0010 08/16/19 0131 08/16/19 0626 08/17/19 0826  PROCALCITON  --   --   --  8.43  WBC 22.6*  --   --  16.0*  LATICACIDVEN  --  3.3* 3.0* 2.4*    Liver Function Tests: Recent Labs  Lab 08/16/19 0010 08/17/19 0826  AST 27 22  ALT 13 16  ALKPHOS 75 52  BILITOT 0.4 0.4  PROT 7.0 6.3*  ALBUMIN 3.2* 2.8*   No results for input(s): LIPASE, AMYLASE in the last 168 hours. No results for input(s): AMMONIA in the last 168 hours.  ABG    Component Value Date/Time   PHART 7.311 (L) 08/16/2019 0036   PCO2ART 36.6 08/16/2019 0036   PO2ART 83 08/16/2019 0036   HCO3 18.4 (L) 08/16/2019 0036   TCO2 20 (L) 08/16/2019 0036   ACIDBASEDEF 7.0 (H) 08/16/2019 0036   O2SAT 95.0 08/16/2019 0036     Coagulation Profile: No results for input(s): INR, PROTIME in the last 168 hours.  Cardiac  Enzymes: No results for input(s): CKTOTAL, CKMB, CKMBINDEX, TROPONINI in the last 168 hours.  HbA1C: No results found for: HGBA1C  CBG: Recent Labs  Lab 08/16/19 0005  GLUCAP 261*    Review of Systems:    Positive Symptoms in bold:  Constitutional fevers, chills, weight loss, fatigue, anorexia, malaise  Eyes decreased vision, double vision, eye irritation  Ears, Nose, Mouth, Throat sore throat, trouble swallowing, sinus congestion  Cardiovascular chest pain, paroxysmal nocturnal dyspnea, lower ext edema, palpitations   Respiratory SOB, cough, DOE, hemoptysis, wheezing  Gastrointestinal nausea, vomiting, diarrhea  Genitourinary burning with urination, trouble urinating  Musculoskeletal joint aches, joint swelling, back pain  Integumentary  rashes, skin lesions  Neurological focal weakness, focal numbness, trouble speaking, headaches  Psychiatric depression, anxiety, confusion  Endocrine polyuria, polydipsia, cold intolerance, heat intolerance  Hematologic abnormal bruising, abnormal bleeding, unexplained nose bleeds  Allergic/Immunologic recurrent infections, hives, swollen lymph nodes     Past Medical History  She,  has a past medical history of ADD (attention deficit disorder), Depression, and Schizoaffective disorder (HCC).   Surgical History    Past Surgical History:  Procedure Laterality Date  . WISDOM TOOTH EXTRACTION       Social History   reports that she has been smoking. She has never used smokeless tobacco. She reports current alcohol use of about 6.0 standard drinks of alcohol per week. She reports that she does not use drugs.   Family History   Her family history is not on file.   Allergies No Known Allergies   Home Medications  Prior to Admission medications   Medication Sig Start Date End Date Taking? Authorizing Provider  acetaminophen (TYLENOL) 500 MG tablet Take 500-1,000 mg by mouth every 6 (six) hours as needed for mild pain or headache.    Yes [provider]  albuterol (VENTOLIN HFA) 108 (90 Base) MCG/ACT inhaler Inhale 2 puffs into the lungs every 6 (six) hours as needed for wheezing or shortness of breath. 07/14/18  Yes Jeannie Fend, PA-C  hydrocortisone cream 1 % Apply 1 application topically daily as needed for itching.   Yes [provider]  metroNIDAZOLE (FLAGYL) 500 MG tablet Take 1 tablet (500 mg total) by mouth 2 (two) times daily. One po bid x 7 days Patient not taking: Reported on 07/13/2018 06/14/14   Geoffery Lyons, MD  montelukast (SINGULAIR) 10 MG tablet Take 1 tablet (10 mg total) by mouth at bedtime for 7 days. 07/14/18 07/21/18  Jeannie Fend, PA-C  QUEtiapine (SEROQUEL) 200 MG tablet Take 1 tablet (200 mg total) by mouth at bedtime as  needed for up to 7 days ("for sleep"). 07/14/18 07/21/18  Jeannie Fend, PA-C

## 2019-08-17 NOTE — Progress Notes (Signed)
SATURATION QUALIFICATIONS: (This note is used to comply with regulatory documentation for home oxygen)  Patient Saturations on Room Air at Rest =  99%  Patient Saturations on Room Air while Ambulating = 96%  Patient Saturations on n/a Liters of oxygen while Ambulating = n/a%  Please briefly explain why patient needs home oxygen: Pt. Was not having shortness of breath

## 2019-08-27 LAB — OPIATES,MS,WB/SP RFX
6-Acetylmorphine: NEGATIVE
Codeine: NEGATIVE ng/mL
Dihydrocodeine: NEGATIVE ng/mL
Hydrocodone: NEGATIVE ng/mL
Hydromorphone: NEGATIVE ng/mL
Morphine: NEGATIVE ng/mL
Opiate Confirmation: NEGATIVE

## 2019-08-27 LAB — OXYCODONES,MS,WB/SP RFX
Oxycocone: NEGATIVE ng/mL
Oxycodones Confirmation: NEGATIVE
Oxymorphone: NEGATIVE ng/mL

## 2019-08-27 LAB — DRUG SCREEN 10 W/CONF, SERUM
Amphetamines, IA: NEGATIVE ng/mL
Barbiturates, IA: NEGATIVE ug/mL
Benzodiazepines, IA: NEGATIVE ng/mL
Cocaine & Metabolite, IA: NEGATIVE ng/mL
Methadone, IA: NEGATIVE ng/mL
Opiates, IA: NEGATIVE ng/mL
Oxycodones, IA: NEGATIVE ng/mL
Phencyclidine, IA: NEGATIVE ng/mL
Propoxyphene, IA: NEGATIVE ng/mL
THC(Marijuana) Metabolite, IA: NEGATIVE ng/mL

## 2019-10-16 ENCOUNTER — Inpatient Hospital Stay (HOSPITAL_COMMUNITY): Payer: Medicaid Other

## 2019-10-16 ENCOUNTER — Inpatient Hospital Stay (HOSPITAL_COMMUNITY)
Admission: EM | Admit: 2019-10-16 | Discharge: 2019-11-11 | DRG: 917 | Disposition: E | Payer: Medicaid Other | Attending: Critical Care Medicine | Admitting: Critical Care Medicine

## 2019-10-16 ENCOUNTER — Emergency Department (HOSPITAL_COMMUNITY): Payer: Medicaid Other

## 2019-10-16 ENCOUNTER — Encounter (HOSPITAL_COMMUNITY): Payer: Medicaid Other

## 2019-10-16 DIAGNOSIS — Z9289 Personal history of other medical treatment: Secondary | ICD-10-CM

## 2019-10-16 DIAGNOSIS — Z6837 Body mass index (BMI) 37.0-37.9, adult: Secondary | ICD-10-CM | POA: Diagnosis not present

## 2019-10-16 DIAGNOSIS — T484X1A Poisoning by expectorants, accidental (unintentional), initial encounter: Secondary | ICD-10-CM | POA: Diagnosis present

## 2019-10-16 DIAGNOSIS — G936 Cerebral edema: Secondary | ICD-10-CM | POA: Diagnosis present

## 2019-10-16 DIAGNOSIS — N179 Acute kidney failure, unspecified: Secondary | ICD-10-CM | POA: Diagnosis present

## 2019-10-16 DIAGNOSIS — I959 Hypotension, unspecified: Secondary | ICD-10-CM | POA: Diagnosis present

## 2019-10-16 DIAGNOSIS — Z20822 Contact with and (suspected) exposure to covid-19: Secondary | ICD-10-CM | POA: Diagnosis present

## 2019-10-16 DIAGNOSIS — F1410 Cocaine abuse, uncomplicated: Secondary | ICD-10-CM | POA: Diagnosis present

## 2019-10-16 DIAGNOSIS — G9382 Brain death: Secondary | ICD-10-CM | POA: Diagnosis not present

## 2019-10-16 DIAGNOSIS — J9602 Acute respiratory failure with hypercapnia: Secondary | ICD-10-CM | POA: Diagnosis present

## 2019-10-16 DIAGNOSIS — E876 Hypokalemia: Secondary | ICD-10-CM | POA: Diagnosis present

## 2019-10-16 DIAGNOSIS — I10 Essential (primary) hypertension: Secondary | ICD-10-CM | POA: Diagnosis not present

## 2019-10-16 DIAGNOSIS — E232 Diabetes insipidus: Secondary | ICD-10-CM | POA: Diagnosis present

## 2019-10-16 DIAGNOSIS — Z9911 Dependence on respirator [ventilator] status: Secondary | ICD-10-CM | POA: Diagnosis not present

## 2019-10-16 DIAGNOSIS — R739 Hyperglycemia, unspecified: Secondary | ICD-10-CM | POA: Diagnosis present

## 2019-10-16 DIAGNOSIS — Z66 Do not resuscitate: Secondary | ICD-10-CM | POA: Diagnosis present

## 2019-10-16 DIAGNOSIS — J9601 Acute respiratory failure with hypoxia: Secondary | ICD-10-CM | POA: Diagnosis present

## 2019-10-16 DIAGNOSIS — I468 Cardiac arrest due to other underlying condition: Secondary | ICD-10-CM | POA: Diagnosis present

## 2019-10-16 DIAGNOSIS — R569 Unspecified convulsions: Secondary | ICD-10-CM | POA: Diagnosis present

## 2019-10-16 DIAGNOSIS — I469 Cardiac arrest, cause unspecified: Secondary | ICD-10-CM | POA: Diagnosis present

## 2019-10-16 DIAGNOSIS — G931 Anoxic brain damage, not elsewhere classified: Secondary | ICD-10-CM | POA: Diagnosis present

## 2019-10-16 DIAGNOSIS — D72829 Elevated white blood cell count, unspecified: Secondary | ICD-10-CM | POA: Diagnosis present

## 2019-10-16 DIAGNOSIS — E669 Obesity, unspecified: Secondary | ICD-10-CM | POA: Diagnosis present

## 2019-10-16 DIAGNOSIS — R570 Cardiogenic shock: Secondary | ICD-10-CM | POA: Diagnosis present

## 2019-10-16 DIAGNOSIS — Z0189 Encounter for other specified special examinations: Secondary | ICD-10-CM

## 2019-10-16 DIAGNOSIS — G253 Myoclonus: Secondary | ICD-10-CM | POA: Diagnosis present

## 2019-10-16 LAB — PROTIME-INR
INR: 1.1 (ref 0.8–1.2)
Prothrombin Time: 13.5 seconds (ref 11.4–15.2)

## 2019-10-16 LAB — BASIC METABOLIC PANEL
Anion gap: 10 (ref 5–15)
BUN: 7 mg/dL (ref 6–20)
CO2: 20 mmol/L — ABNORMAL LOW (ref 22–32)
Calcium: 7.8 mg/dL — ABNORMAL LOW (ref 8.9–10.3)
Chloride: 107 mmol/L (ref 98–111)
Creatinine, Ser: 0.95 mg/dL (ref 0.44–1.00)
GFR calc non Af Amer: >60 mL/min (ref >60)
Glucose, Bld: 190 mg/dL — ABNORMAL HIGH (ref 70–99)
Potassium: 3.4 mmol/L — ABNORMAL LOW (ref 3.5–5.1)
Sodium: 137 mmol/L (ref 135–145)

## 2019-10-16 LAB — RAPID URINE DRUG SCREEN, HOSP PERFORMED
Amphetamines: NOT DETECTED
Barbiturates: NOT DETECTED
Benzodiazepines: NOT DETECTED
Cocaine: POSITIVE — AB
Opiates: NOT DETECTED
Tetrahydrocannabinol: NOT DETECTED

## 2019-10-16 LAB — CBC WITH DIFFERENTIAL/PLATELET
Abs Immature Granulocytes: 0 10*3/uL (ref 0.00–0.07)
Band Neutrophils: 1 %
Basophils Absolute: 0 10*3/uL (ref 0.0–0.1)
Basophils Relative: 0 %
Eosinophils Absolute: 0 10*3/uL (ref 0.0–0.5)
Eosinophils Relative: 0 %
HCT: 36.4 % (ref 36.0–46.0)
Hemoglobin: 10.7 g/dL — ABNORMAL LOW (ref 12.0–15.0)
Lymphocytes Relative: 64 %
Lymphs Abs: 10 10*3/uL — ABNORMAL HIGH (ref 0.7–4.0)
MCH: 27.2 pg (ref 26.0–34.0)
MCHC: 29.4 g/dL — ABNORMAL LOW (ref 30.0–36.0)
MCV: 92.4 fL (ref 80.0–100.0)
Monocytes Absolute: 0.6 10*3/uL (ref 0.1–1.0)
Monocytes Relative: 4 %
Neutro Abs: 5 10*3/uL (ref 1.7–7.7)
Neutrophils Relative %: 31 %
Platelets: 321 10*3/uL (ref 150–400)
RBC: 3.94 MIL/uL (ref 3.87–5.11)
RDW: 17.4 % — ABNORMAL HIGH (ref 11.5–15.5)
WBC: 15.7 10*3/uL — ABNORMAL HIGH (ref 4.0–10.5)
nRBC: 0 % (ref 0.0–0.2)
nRBC: 0 /100 WBC

## 2019-10-16 LAB — I-STAT ARTERIAL BLOOD GAS, ED
Acid-base deficit: 4 mmol/L — ABNORMAL HIGH (ref 0.0–2.0)
Bicarbonate: 23.7 mmol/L (ref 20.0–28.0)
Calcium, Ion: 1.2 mmol/L (ref 1.15–1.40)
HCT: 36 % (ref 36.0–46.0)
Hemoglobin: 12.2 g/dL (ref 12.0–15.0)
O2 Saturation: 100 %
Patient temperature: 98.6
Potassium: 3 mmol/L — ABNORMAL LOW (ref 3.5–5.1)
Sodium: 137 mmol/L (ref 135–145)
TCO2: 25 mmol/L (ref 22–32)
pCO2 arterial: 53.4 mmHg — ABNORMAL HIGH (ref 32.0–48.0)
pH, Arterial: 7.256 — ABNORMAL LOW (ref 7.350–7.450)
pO2, Arterial: 364 mmHg — ABNORMAL HIGH (ref 83.0–108.0)

## 2019-10-16 LAB — COMPREHENSIVE METABOLIC PANEL
ALT: 55 U/L — ABNORMAL HIGH (ref 0–44)
AST: 104 U/L — ABNORMAL HIGH (ref 15–41)
Albumin: 3.2 g/dL — ABNORMAL LOW (ref 3.5–5.0)
Alkaline Phosphatase: 95 U/L (ref 38–126)
Anion gap: 17 — ABNORMAL HIGH (ref 5–15)
BUN: 5 mg/dL — ABNORMAL LOW (ref 6–20)
CO2: 15 mmol/L — ABNORMAL LOW (ref 22–32)
Calcium: 8.8 mg/dL — ABNORMAL LOW (ref 8.9–10.3)
Chloride: 103 mmol/L (ref 98–111)
Creatinine, Ser: 1.44 mg/dL — ABNORMAL HIGH (ref 0.44–1.00)
GFR calc non Af Amer: 49 mL/min — ABNORMAL LOW (ref >60)
Glucose, Bld: 268 mg/dL — ABNORMAL HIGH (ref 70–99)
Potassium: 3.8 mmol/L (ref 3.5–5.1)
Sodium: 135 mmol/L (ref 135–145)
Total Bilirubin: 0.5 mg/dL (ref 0.3–1.2)
Total Protein: 6.9 g/dL (ref 6.5–8.1)

## 2019-10-16 LAB — CBG MONITORING, ED: Glucose-Capillary: 197 mg/dL — ABNORMAL HIGH (ref 70–99)

## 2019-10-16 LAB — I-STAT CHEM 8, ED
BUN: 5 mg/dL — ABNORMAL LOW (ref 6–20)
Calcium, Ion: 1.13 mmol/L — ABNORMAL LOW (ref 1.15–1.40)
Chloride: 101 mmol/L (ref 98–111)
Creatinine, Ser: 1.3 mg/dL — ABNORMAL HIGH (ref 0.44–1.00)
Glucose, Bld: 260 mg/dL — ABNORMAL HIGH (ref 70–99)
HCT: 36 % (ref 36.0–46.0)
Hemoglobin: 12.2 g/dL (ref 12.0–15.0)
Potassium: 3.7 mmol/L (ref 3.5–5.1)
Sodium: 136 mmol/L (ref 135–145)
TCO2: 18 mmol/L — ABNORMAL LOW (ref 22–32)

## 2019-10-16 LAB — HEMOGLOBIN A1C
Hgb A1c MFr Bld: 5.6 % (ref 4.8–5.6)
Mean Plasma Glucose: 114.02 mg/dL

## 2019-10-16 LAB — ETHANOL: Alcohol, Ethyl (B): <10 mg/dL (ref <10)

## 2019-10-16 LAB — LACTIC ACID, PLASMA
Lactic Acid, Venous: 7.1 mmol/L (ref 0.5–1.9)
Lactic Acid, Venous: 2.6 mmol/L (ref 0.5–1.9)

## 2019-10-16 LAB — PHOSPHORUS: Phosphorus: 2.8 mg/dL (ref 2.5–4.6)

## 2019-10-16 LAB — RESPIRATORY PANEL BY RT PCR (FLU A&B, COVID)
Influenza A by PCR: NEGATIVE
Influenza B by PCR: NEGATIVE
SARS Coronavirus 2 by RT PCR: NEGATIVE

## 2019-10-16 LAB — MAGNESIUM: Magnesium: 1.6 mg/dL — ABNORMAL LOW (ref 1.7–2.4)

## 2019-10-16 LAB — TROPONIN I (HIGH SENSITIVITY)
Troponin I (High Sensitivity): 27 ng/L — ABNORMAL HIGH (ref <18)
Troponin I (High Sensitivity): 179 ng/L (ref <18)

## 2019-10-16 LAB — MRSA PCR SCREENING: MRSA by PCR: NEGATIVE

## 2019-10-16 LAB — SALICYLATE LEVEL: Salicylate Lvl: <7 mg/dL — ABNORMAL LOW (ref 7.0–30.0)

## 2019-10-16 LAB — GLUCOSE, CAPILLARY
Glucose-Capillary: 171 mg/dL — ABNORMAL HIGH (ref 70–99)
Glucose-Capillary: 169 mg/dL — ABNORMAL HIGH (ref 70–99)

## 2019-10-16 LAB — ACETAMINOPHEN LEVEL: Acetaminophen (Tylenol), Serum: <10 ug/mL — ABNORMAL LOW (ref 10–30)

## 2019-10-16 LAB — TRIGLYCERIDES: Triglycerides: 193 mg/dL — ABNORMAL HIGH (ref <150)

## 2019-10-16 LAB — ABO/RH: ABO/RH(D): A POS

## 2019-10-16 LAB — I-STAT BETA HCG BLOOD, ED (MC, WL, AP ONLY): I-stat hCG, quantitative: <5 m[IU]/mL (ref <5)

## 2019-10-16 LAB — APTT: aPTT: 32 seconds (ref 24–36)

## 2019-10-16 MED ORDER — PROPOFOL 1000 MG/100ML IV EMUL
INTRAVENOUS | Status: AC
  Administered 2019-10-16: 5 ug/kg/min via INTRAVENOUS
  Filled 2019-10-16: qty 100

## 2019-10-16 MED ORDER — SODIUM CHLORIDE 0.9 % IV BOLUS
1000.0000 mL | Freq: Once | INTRAVENOUS | Status: AC
  Administered 2019-10-16: 1000 mL via INTRAVENOUS

## 2019-10-16 MED ORDER — INSULIN ASPART 100 UNIT/ML ~~LOC~~ SOLN
0.0000 [IU] | SUBCUTANEOUS | Status: DC
  Administered 2019-10-16 – 2019-10-17 (×3): 3 [IU] via SUBCUTANEOUS

## 2019-10-16 MED ORDER — PANTOPRAZOLE SODIUM 40 MG IV SOLR
40.0000 mg | Freq: Every day | INTRAVENOUS | Status: DC
  Administered 2019-10-16: 40 mg via INTRAVENOUS
  Filled 2019-10-16: qty 40

## 2019-10-16 MED ORDER — VANCOMYCIN HCL 10 G IV SOLR
2000.0000 mg | Freq: Once | INTRAVENOUS | Status: DC
  Filled 2019-10-16: qty 2000

## 2019-10-16 MED ORDER — PROPOFOL 10 MG/ML IV BOLUS: 100.0000 mg | Freq: Once | INTRAVENOUS | Status: AC

## 2019-10-16 MED ORDER — ASPIRIN 300 MG RE SUPP: 300.0000 mg | RECTAL | Status: AC

## 2019-10-16 MED ORDER — IOHEXOL 350 MG/ML SOLN
75.0000 mL | Freq: Once | INTRAVENOUS | Status: AC | PRN
  Administered 2019-10-16: 75 mL via INTRAVENOUS

## 2019-10-16 MED ORDER — HEPARIN SODIUM (PORCINE) 5000 UNIT/ML IJ SOLN
5000.0000 [IU] | Freq: Three times a day (TID) | INTRAMUSCULAR | Status: DC
  Administered 2019-10-16 – 2019-10-17 (×3): 5000 [IU] via SUBCUTANEOUS
  Filled 2019-10-16 (×3): qty 1

## 2019-10-16 MED ORDER — PIPERACILLIN-TAZOBACTAM 3.375 G IVPB 30 MIN: 3.3750 g | Freq: Once | INTRAVENOUS | Status: DC

## 2019-10-16 MED ORDER — NOREPINEPHRINE 4 MG/250ML-% IV SOLN
0.0000 ug/min | INTRAVENOUS | Status: DC
  Administered 2019-10-17: 6 ug/min via INTRAVENOUS
  Filled 2019-10-16: qty 250

## 2019-10-16 MED ORDER — ROCURONIUM BROMIDE 50 MG/5ML IV SOLN
50.0000 mg | Freq: Once | INTRAVENOUS | Status: AC
  Filled 2019-10-16: qty 5

## 2019-10-16 MED ORDER — LACTATED RINGERS IV SOLN: INTRAVENOUS | Status: DC

## 2019-10-16 MED ORDER — FENTANYL CITRATE (PF) 100 MCG/2ML IJ SOLN: 50.0000 ug | Freq: Once | INTRAMUSCULAR | Status: AC

## 2019-10-16 MED ORDER — PROSOURCE TF PO LIQD
45.0000 mL | Freq: Two times a day (BID) | ORAL | Status: DC
  Filled 2019-10-16: qty 45

## 2019-10-16 MED ORDER — MIDAZOLAM HCL 2 MG/2ML IJ SOLN
INTRAMUSCULAR | Status: AC
  Administered 2019-10-16: 2 mg via INTRAVENOUS
  Filled 2019-10-16: qty 2

## 2019-10-16 MED ORDER — VITAL HIGH PROTEIN PO LIQD: 1000.0000 mL | ORAL | Status: DC

## 2019-10-16 MED ORDER — FENTANYL CITRATE (PF) 100 MCG/2ML IJ SOLN
INTRAMUSCULAR | Status: AC
  Administered 2019-10-16: 50 ug
  Filled 2019-10-16: qty 2

## 2019-10-16 MED ORDER — ROCURONIUM BROMIDE 10 MG/ML (PF) SYRINGE
PREFILLED_SYRINGE | INTRAVENOUS | Status: AC
  Administered 2019-10-16: 100 mg
  Filled 2019-10-16: qty 10

## 2019-10-16 MED ORDER — SODIUM CHLORIDE 0.9 % IV SOLN
2.0000 g | INTRAVENOUS | Status: DC
  Administered 2019-10-17 (×2): 2 g via INTRAVENOUS
  Filled 2019-10-16: qty 20
  Filled 2019-10-16: qty 2

## 2019-10-16 MED ORDER — MIDAZOLAM HCL 2 MG/2ML IJ SOLN
1.0000 mg | INTRAMUSCULAR | Status: DC | PRN
  Administered 2019-10-16: 2 mg via INTRAVENOUS

## 2019-10-16 MED ORDER — PROPOFOL 10 MG/ML IV BOLUS
INTRAVENOUS | Status: AC
  Administered 2019-10-16: 100 mg via INTRAVENOUS
  Filled 2019-10-16: qty 20

## 2019-10-16 MED ORDER — FENTANYL CITRATE (PF) 100 MCG/2ML IJ SOLN
INTRAMUSCULAR | Status: AC
  Administered 2019-10-16: 100 ug
  Filled 2019-10-16: qty 2

## 2019-10-16 MED ORDER — EPINEPHRINE HCL 5 MG/250ML IV SOLN IN NS
0.5000 ug/min | INTRAVENOUS | Status: DC
  Administered 2019-10-16: 4 ug/min via INTRAVENOUS
  Filled 2019-10-16: qty 250

## 2019-10-16 MED ORDER — SODIUM CHLORIDE 0.9 % IV SOLN
750.0000 mg | Freq: Two times a day (BID) | INTRAVENOUS | Status: DC
  Administered 2019-10-17: 750 mg via INTRAVENOUS
  Filled 2019-10-16 (×3): qty 7.5

## 2019-10-16 MED ORDER — PROPOFOL 1000 MG/100ML IV EMUL
5.0000 ug/kg/min | INTRAVENOUS | Status: DC
  Administered 2019-10-17: 60 ug/kg/min via INTRAVENOUS
  Filled 2019-10-16: qty 100

## 2019-10-16 MED ORDER — VANCOMYCIN HCL 2000 MG/400ML IV SOLN
2000.0000 mg | Freq: Once | INTRAVENOUS | Status: AC
  Administered 2019-10-16: 2000 mg via INTRAVENOUS
  Filled 2019-10-16: qty 400

## 2019-10-16 MED ORDER — LEVETIRACETAM IN NACL 1500 MG/100ML IV SOLN
1500.0000 mg | Freq: Once | INTRAVENOUS | Status: AC
  Administered 2019-10-16: 1500 mg via INTRAVENOUS
  Filled 2019-10-16: qty 100

## 2019-10-16 NOTE — Procedures (Signed)
Arterial Catheter Insertion Procedure Note  Trezure Cronk  [id number]  18-Nov-1990  Date:11-09-19  Time:9:47 PM    Provider Performing: Tobey Grim    Procedure: Insertion of Arterial Line (80998) with US guidance (33825)   Indication(s) Blood pressure monitoring and/or need for frequent ABGs  Consent Unable to obtain consent due to emergent nature of procedure.  Anesthesia None   Time Out Verified patient identification, verified procedure, site/side was marked, verified correct patient position, special equipment/implants available, medications/allergies/relevant history reviewed, required imaging and test results available.   Sterile Technique Maximal sterile technique including full sterile barrier drape, hand hygiene, sterile gown, sterile gloves, mask, hair covering, sterile ultrasound probe cover (if used).   Procedure Description Area of catheter insertion was cleaned with chlorhexidine and draped in sterile fashion. With real-time ultrasound guidance an arterial catheter was placed into the right radial artery.  Appropriate arterial tracings confirmed on monitor.     Complications/Tolerance None; patient tolerated the procedure well.   EBL Minimal   Specimen(s) None

## 2019-10-16 NOTE — ED Triage Notes (Signed)
Pt BIB [facility], found pulseless and apneic by family, last seen 20 minutes prior to. EMS found pt initially in asystole, given 2 round of epi, after 10-12 minutes CPR ROSC achieved. Per EMS, pt with hx of drug use, unknown use today.

## 2019-10-16 NOTE — Progress Notes (Signed)
Pulled back ETT 3CM per MD order.

## 2019-10-16 NOTE — Progress Notes (Signed)
EEG complete - results pending 

## 2019-10-16 NOTE — H&P (Signed)
NAME:  Erica Dixon, MRN:  [id number], DOB:  Oct 07, 1990, LOS: 0 ADMISSION DATE:  10/23/2019, CONSULTATION DATE:  11/01/2019 REFERRING MD:  Madilyn Hook, CHIEF COMPLAINT:  Cardiac arrest   Brief History   29 year old woman with hx of polysubstance abuse presenting after OOH cardiac arrest.  History of present illness   29 year old woman with hx of polysubstance abuse presenting after OOH cardiac arrest.  Initial rhythm PEA.  Unknown downtime prior to compressions. 20 minutes compressions before ROSC.  Unresponsive. Per report may have took some "triple C" cough syrup.  Head CT pending. PCCM consulted for admission, no family at bedisde.  Past Medical History  ADD Schizoaffective disorder Opiate use disorder   Significant Hospital Events   10/6 admitted  Consults:  n/a  Procedures:  n/a  Significant Diagnostic Tests:  CXR: low ETT, lungs otherwise benign CT head>> CTA Chest>>  Micro Data:  COVID >>  Antimicrobials:  none   Interim history/subjective:  Admitting  Objective   Blood pressure (!) 71/37, height 5\' 8"  (1.727 m).    Vent Mode: PRVC FiO2 (%):  [100 %] 100 % Set Rate:  [16 bmp] 16 bmp Vt Set:  [510 mL] 510 mL PEEP:  [5 cmH20] 5 cmH20 Plateau Pressure:  [18 cmH20] 18 cmH20  No intake or output data in the 24 hours ending 10/21/2019 1735 There were no vitals filed for this visit.  Examination: Constitutional: ill appearing unresponsive woman in no acute distress Eyes: eyes are midline, L pupil fixed, R sluggish Ears, nose, mouth, and throat: ETT with thick mucoid secretions Cardiovascular: heart sounds are regular, ext are warm to touch. No edema Respiratory: scattered rhonci, agonal breathing pattern otherwise passive on vent Gastrointestinal: abdomen is soft with + BS Skin: No rashes, normal turgor Neurologic:  GCS3 No cough, no gag, no corneals Psychiatric: Cannot assess  Hgb benign Hcg neg Sinus tach w/o STEMI on EKG BMP mild AKI  Resolved Hospital  Problem list   n/a  Assessment & Plan:  OOH PEA arrest- 20 minutes CPR.  Hx polysubstance abuse.  Current neuro exam poor. - UDS, trops, echo, CTA chest, CT head - If CT head showing no bleed, TTM protocol - Awaiting family arrival  Respiratory failure- agonal breathing presently, intubated without sedation - Usual vent bundle - Check AM CXR  Best practice:  Diet: TF Pain/Anxiety/Delirium protocol (if indicated): PRN for now VAP protocol (if indicated): in place DVT prophylaxis: SCDs pending head CT GI prophylaxis: PPI Glucose control: SSI Mobility: BR Code Status: Full Family Communication: awaiting arrival Disposition: ICU   Medical Decision Making    Diagnoses that are immediately life threatening include cardiac arrest, resp failure Critical test findings: GCS3, limited brainstem reflexes Interventions today to address these diagnoses are CT head, CTA chest, targeted temperature management Likelihood of life-threatening deterioration without intervention is high.  Labs   CBC: Recent Labs  Lab 10/26/2019 1710 10/24/2019 1722  WBC 15.7*  --   NEUTROABS PENDING  --   HGB 10.7* 12.2  HCT 36.4 36.0  MCV 92.4  --   PLT 321  --     Basic Metabolic Panel: Recent Labs  Lab 10/26/2019 1722  NA 136  K 3.7  CL 101  GLUCOSE 260*  BUN 5*  CREATININE 1.30*   GFR: CrCl cannot be calculated (Unknown ideal weight.). Recent Labs  Lab 10/29/2019 1710  WBC 15.7*    Liver Function Tests: No results for input(s): AST, ALT, ALKPHOS, BILITOT, PROT, ALBUMIN  in the last 168 hours. No results for input(s): LIPASE, AMYLASE in the last 168 hours. No results for input(s): AMMONIA in the last 168 hours.  ABG    Component Value Date/Time   TCO2 18 (L) 11/02/2019 1722     Coagulation Profile: No results for input(s): INR, PROTIME in the last 168 hours.  Cardiac Enzymes: No results for input(s): CKTOTAL, CKMB, CKMBINDEX, TROPONINI in the last 168 hours.  HbA1C: No results  found for: HGBA1C  CBG: No results for input(s): GLUCAP in the last 168 hours.  Review of Systems:   Cannot assess, comatose  Past Medical History  See above  Surgical History   Wisdom tooth removal  Social History    + for alcohol/tobacco/cocaine/opiates  Family History   Unknown fam hx  Allergies Not on File   Home Medications   Prior to Admission medications   Medication Sig Start Date End Date Taking? Authorizing Provider  acetaminophen (TYLENOL) 500 MG tablet Take 500-1,000 mg by mouth every 6 (six) hours as needed for mild pain or headache.   Yes [provider]  albuterol (VENTOLIN HFA) 108 (90 Base) MCG/ACT inhaler Inhale 2 puffs into the lungs every 6 (six) hours as needed for wheezing or shortness of breath. 07/14/18  Yes Jeannie Fend, PA-C  hydrocortisone cream 1 % Apply 1 application topically daily as needed for itching.   Yes [provider]  montelukast (SINGULAIR) 10 MG tablet Take 1 tablet (10 mg total) by mouth at bedtime for 7 days. 07/14/18 07/21/18  Jeannie Fend, PA-C  montelukast (SINGULAIR) 10 MG tablet Take 1 tablet (10 mg total) by mouth at bedtime. 08/17/19 09/16/19  Hughie Closs, MD  QUEtiapine (SEROQUEL) 200 MG tablet Take 1 tablet (200 mg total) by mouth at bedtime as needed for up to 7 days ("for sleep"). 07/14/18 07/21/18  Jeannie Fend, PA-C    Critical care time: 45 minutes not including any separately billable procedures

## 2019-10-16 NOTE — Significant Event (Signed)
Called to bedside for evaluation. Patient is biting ETT, jaw is clamped, unable to place bite block. Concern for seizure vs myoclonus, noted facial twitching.   Given 50 mg propofol push x 2 without effect. Given Fentanyl and 50 mcg ROC. After jaw unclamped and able to place bite block and OG tube.   A.Line placed for frequent labs draws. At this time patient is not on vasopressors or any further medications requiring a CVC. Have instructed nurse to notify if this changes.   Jovita Kussmaul, AGACNP-BC [facility]  PCCM Pgr: [phone]

## 2019-10-16 NOTE — ED Provider Notes (Signed)
[facility]   CSN: [id number] Arrival date & time: 11/08/2019  1708     History Chief Complaint  Patient presents with  . Cardiac Arrest    Erica Dixon is a 29 y.o. female.  The history is provided by the EMS personnel. No language interpreter was used.  Cardiac Arrest  Erica Dixon is a 29 y.o. female who presents to the Emergency Department complaining of cardiac arrest. Level V caveat due to unresponsiveness. History is provided by EMS. EMS was called out after patient was found unresponsive. She was last seen by a friend 20 minutes prior. On EMS arrival she was found, pulseless and and asystole. CPR was initiated and she was intubated and treated with two rounds of epinephrine. She had return of circulation after about 10 to 12 minutes of CPR. IO placed in right tibia prior to ED arrival. Per reports she does have a history of drug use.    No past medical history on file.  Patient Active Problem List   Diagnosis Date Noted  . Cardiac arrest (HCC) 2019/02/24      OB History   No obstetric history on file.     No family history on file.  Social History   Tobacco Use  . Smoking status: Not on file  Substance Use Topics  . Alcohol use: Not on file  . Drug use: Not on file    Home Medications Prior to Admission medications   Not on File    Allergies    Patient has no allergy information on record.  Review of Systems   Review of Systems  Unable to perform ROS: Patient unresponsive    Physical Exam Updated Vital Signs BP (!) 146/95   Pulse (!) 129   Resp 18   Ht 5\' 8"  (1.727 m)   SpO2 99%   Physical Exam Vitals and nursing note reviewed.  Constitutional:      Appearance: She is well-developed. She is ill-appearing.  HENT:     Head: Normocephalic and atraumatic.  Cardiovascular:     Rate and Rhythm: Regular rhythm. Tachycardia present.     Heart sounds: No murmur heard.   Pulmonary:      Comments: Symmetric air movement bilaterally with bagged ventilations. No spontaneous respirations. 7-0 Endotracheal tube in place. Abdominal:     Palpations: Abdomen is soft.     Tenderness: There is no abdominal tenderness. There is no guarding or rebound.  Musculoskeletal:        General: No swelling or tenderness.     Comments: IO in right anterior shin  Skin:    General: Skin is warm and dry.  Neurological:     Comments: GCS one - one - one.  Pupils dilated and unreactive bilaterally  Psychiatric:        Behavior: Behavior normal.     ED Results / Procedures / Treatments   Labs (all labs ordered are listed, but only abnormal results are displayed) Labs Reviewed  COMPREHENSIVE METABOLIC PANEL - Abnormal; Notable for the following components:      Result Value   CO2 15 (*)    Glucose, Bld 268 (*)    BUN 5 (*)    Creatinine, Ser 1.44 (*)    Calcium 8.8 (*)    Albumin 3.2 (*)    AST 104 (*)    ALT 55 (*)    GFR calc non Af Amer 49 (*)    Anion gap 17 (*)  All other components within normal limits  LACTIC ACID, PLASMA - Abnormal; Notable for the following components:   Lactic Acid, Venous 7.1 (*)    All other components within normal limits  CBC WITH DIFFERENTIAL/PLATELET - Abnormal; Notable for the following components:   WBC 15.7 (*)    Hemoglobin 10.7 (*)    MCHC 29.4 (*)    RDW 17.4 (*)    Lymphs Abs 10.0 (*)    All other components within normal limits  I-STAT CHEM 8, ED - Abnormal; Notable for the following components:   BUN 5 (*)    Creatinine, Ser 1.30 (*)    Glucose, Bld 260 (*)    Calcium, Ion 1.13 (*)    TCO2 18 (*)    All other components within normal limits  I-STAT ARTERIAL BLOOD GAS, ED - Abnormal; Notable for the following components:   pH, Arterial 7.256 (*)    pCO2 arterial 53.4 (*)    pO2, Arterial 364 (*)    Acid-base deficit 4.0 (*)    Potassium 3.0 (*)    All other components within normal limits  CBG MONITORING, ED - Abnormal;  Notable for the following components:   Glucose-Capillary 197 (*)    All other components within normal limits  TROPONIN I (HIGH SENSITIVITY) - Abnormal; Notable for the following components:   Troponin I (High Sensitivity) 27 (*)    All other components within normal limits  RESPIRATORY PANEL BY RT PCR (FLU A&B, COVID)  CULTURE, BLOOD (ROUTINE X 2)  CULTURE, BLOOD (ROUTINE X 2)  PROTIME-INR  ETHANOL  ACETAMINOPHEN LEVEL  SALICYLATE LEVEL  LACTIC ACID, PLASMA  BASIC METABOLIC PANEL  BASIC METABOLIC PANEL  PROTIME-INR  APTT  BLOOD GAS, ARTERIAL  BLOOD GAS, ARTERIAL  HEMOGLOBIN A1C  MAGNESIUM  MAGNESIUM  PHOSPHORUS  PHOSPHORUS  RAPID URINE DRUG SCREEN, HOSP PERFORMED  I-STAT BETA HCG BLOOD, ED ([facility], [facility], [facility] ONLY)  TYPE AND SCREEN  ABO/RH  TROPONIN I (HIGH SENSITIVITY)  TROPONIN I (HIGH SENSITIVITY)  TROPONIN I (HIGH SENSITIVITY)    EKG None  Radiology CT Head Wo Contrast  Result Date: 11/03/2019 CLINICAL DATA:  Altered mental status, cardiac arrest EXAM: CT HEAD WITHOUT CONTRAST TECHNIQUE: Contiguous axial images were obtained from the base of the skull through the vertex without intravenous contrast. COMPARISON:  None. FINDINGS: Brain: There is normal anatomic configuration of the brain. There is moderate diffuse cerebral edema with effacement of the sulci at the vertex, effacement of the lateral and third ventricles, effacement of the prepontine and quadrigeminal plate cisterns, and effacement of the suprasellar cistern without frank uncal herniation at this time. No acute intracranial hemorrhage or infarct. No midline shift. No abnormal intra or extra-axial mass lesion or fluid collection. Cerebellum is unremarkable. Vascular: No asymmetric hyperdense vasculature at the skull base. Skull: Intact Sinuses/Orbits: Fluid opacification of several ethmoid air cells and layering fluid within the sphenoid and maxillary sinuses is nonspecific in the setting of intubation. The orbits are  unremarkable. Other: The mastoid air cells and middle ear cavities are clear. IMPRESSION: Diffuse mass effect most in keeping with moderate cerebral edema without frank herniation at this time. No superimposed acute intracranial hemorrhage or infarct. Electronically Signed   By: Helyn Numbers MD   On: 11/09/2019 18:40   CT Angio Chest PE W/Cm &/Or Wo Cm  Result Date: 10/14/2019 CLINICAL DATA:  Cardiopulmonary arrest EXAM: CT ANGIOGRAPHY CHEST WITH CONTRAST TECHNIQUE: Multidetector CT imaging of the chest was performed using the standard protocol  during bolus administration of intravenous contrast. Multiplanar CT image reconstructions and MIPs were obtained to evaluate the vascular anatomy. CONTRAST:  60mL OMNIPAQUE IOHEXOL 350 MG/ML SOLN COMPARISON:  08/16/2019 FINDINGS: Cardiovascular: Satisfactory opacification of the pulmonary arteries to the segmental level. No evidence of pulmonary embolism. Normal heart size. No pericardial effusion. Mediastinum/Nodes: Endotracheal tube extends into the right mainstem bronchus. No pathologic mediastinal adenopathy. Thyroid unremarkable. Esophagus unremarkable. Lungs/Pleura: Extensive airspace infiltrate noted on prior examination has significantly improved in the interval, though has not yet completely resolved. No pneumothorax or pleural effusion. No central obstructing mass. Upper Abdomen: No acute abnormality. Musculoskeletal: No chest wall abnormality. No acute or significant osseous findings. Review of the MIP images confirms the above findings. IMPRESSION: No pulmonary embolism. Right mainstem intubation. Resolving multifocal pulmonary infiltrates, likely infectious or inflammatory. These results were called by telephone at the time of interpretation on 10/11/2019 at 6:49 pm to provider [facility] , who verbally acknowledged these results. Electronically Signed   By: Helyn Numbers MD   On: 10/12/2019 18:51   DG Chest Port 1 View  Result Date:  11/01/2019 CLINICAL DATA:  Cardiac arrest.  Endotracheal tube placement. EXAM: PORTABLE CHEST 1 VIEW COMPARISON:  08/17/2019 FINDINGS: The endotracheal tube tip is at the right mainstem bronchus. Recommend retraction of approximately 2-3 cm. Enteric tube is in place. Tip is below the diaphragm not included in the field of view. The side-port in the region of the distal esophagus. Previous bilateral airspace opacities have resolved. No evidence of acute or residual consolidation. Heart is normal in size. No pleural fluid or pneumothorax. No acute osseous abnormalities are seen. IMPRESSION: 1. Endotracheal tube tip at the right mainstem bronchus. Recommend retraction of approximately 2-3 cm. 2. Enteric tube in place with side-port in the distal esophagus. Recommend advancement. 3. Resolution of bilateral airspace opacities from prior exam. Results called to Dr. Madilyn Hook at 5:55 p.m. on 10/27/2019. Electronically Signed   By: Narda Rutherford M.D.   On: 10/19/2019 17:55    Procedures Procedures (including critical care time) CRITICAL CARE Performed by: Tilden Fossa   Total critical care time: 40 minutes  Critical care time was exclusive of separately billable procedures and treating other patients.  Critical care was necessary to treat or prevent imminent or life-threatening deterioration.  Critical care was time spent personally by me on the following activities: development of treatment plan with patient and/or surrogate as well as nursing, discussions with consultants, evaluation of patient's response to treatment, examination of patient, obtaining history from patient or surrogate, ordering and performing treatments and interventions, ordering and review of laboratory studies, ordering and review of radiographic studies, pulse oximetry and re-evaluation of patient's condition.  Medications Ordered in ED Medications  EPINEPHrine (ADRENALIN) 4 mg in NS 250 mL (0.016 mg/mL) premix infusion (4 mcg/min  Intravenous New Bag/Given 10/27/2019 1717)  norepinephrine (LEVOPHED) 4mg  in premix infusion (has no administration in time range)  aspirin suppository 300 mg (has no administration in time range)  insulin aspart (novoLOG) injection 0-15 Units (3 Units Subcutaneous Given 10/28/2019 1858)  pantoprazole (PROTONIX) injection 40 mg (has no administration in time range)  feeding supplement (VITAL HIGH PROTEIN) liquid 1,000 mL (has no administration in time range)  feeding supplement (PROSource TF) liquid 45 mL (has no administration in time range)  vancomycin (VANCOREADY) IVPB 2000 mg/400 mL (2,000 mg Intravenous New Bag/Given 10/29/2019 1840)  heparin injection 5,000 Units (has no administration in time range)  cefTRIAXone (ROCEPHIN) 2 g in sodium chloride 0.9 %  100 mL IVPB (has no administration in time range)  sodium chloride 0.9 % bolus 1,000 mL (1,000 mLs Intravenous New Bag/Given 11/06/2019 1850)  sodium chloride 0.9 % bolus 1,000 mL (1,000 mLs Intravenous New Bag/Given 11/08/2019 1822)  sodium chloride 0.9 % bolus 1,000 mL (0 mLs Intravenous Stopped 10/27/2019 1851)  iohexol (OMNIPAQUE) 350 MG/ML injection 75 mL (75 mLs Intravenous Contrast Given 10/15/2019 1801)  fentaNYL (SUBLIMAZE) 100 MCG/2ML injection (100 mcg  Given 11/01/2019 1841)    ED Course  I have reviewed the triage vital signs and the nursing notes.  Pertinent labs & imaging results that were available during my care of the patient were reviewed by me and considered in my medical decision making (see chart for details).    MDM Rules/Calculators/A&P                          Patient presents the emergency department following cardiac arrest for asystole. Patient with return of circulation prior to ED arrival. Endotracheal tube placed by EMS. Chest x-ray demonstrates tube is in the right mainstream, will retract. Patient with hypotension in the emergency department and was treated with IV fluids, empiric antibiotics as well as epi drip for blood  pressure support. CTA negative for PE. CT head concerning for cerebral edema. Critical care consulted for admission and further treatment.   Final Clinical Impression(s) / ED Diagnoses Final diagnoses:  Cardiac arrest [facility])    Rx / DC Orders ED Discharge Orders    None       Tilden Fossa, MD 10/12/2019 1952

## 2019-10-16 NOTE — Progress Notes (Signed)
eLink Physician-Brief Progress Note Patient Name: Erica Dixon DOB: 06/15/1990 MRN: [id number]   Date of Service  10/30/2019  HPI/Events of Note  Myoclonus - Patient biting on ETT.   eICU Interventions  Plan: 1. Versed 1-2 mg IV Q 2 hours PRN agitation, sedation, seizures or myoclonus.  2. Will ask ground team to evaluate the patient at bedside.      Intervention Category Major Interventions: Respiratory failure - evaluation and management;Delirium, psychosis, severe agitation - evaluation and management  [name] [place] 10/15/2019, 8:31 PM

## 2019-10-16 NOTE — Progress Notes (Signed)
eLink Physician-Brief Progress Note Patient Name: Erica Dixon DOB: 1991-01-03 MRN: [id number]   Date of Service  11/01/2019  HPI/Events of Note  Head CT Scan reveals diffuse mass effect most in keeping with moderate cerebral edema without frank herniation at this time. No superimposed acute intracranial hemorrhage or infarct.  eICU Interventions  Prognosis for survival is poor.      Intervention Category Major Interventions: Change in mental status - evaluation and management  Shoji Pertuit [place] 11/04/2019, 8:44 PM

## 2019-10-16 NOTE — Consult Note (Signed)
Neurology Consultation  Reason for Consult: Abnormal movements post cardiac arrest Referring Physician: Dr. Elige Radon Icard: PCCM  CC: Post cardiac arrest twitching  History is obtained from: Chart review  HPI: Erica Dixon is a 29 y.o. female past medical history of polysubstance abuse admitted to the ICU after out of hospital cardiac arrest with initial rhythm PEA.  Unknown downtime prior to resumption of CPR.  20 minutes of compressions before ROSC.  Patient was unresponsive throughout.  HPI mentions some triple C cough syrup.  Urine toxicology positive for cocaine. No family at bedside at this time. In the ICU, patient is noted to have twitching and jerking movements with biting on the ET tube along with fluttering of eyelids. She was given 50 mg propofol push x2 and fentanyl.  After that she was given some rocuronium. Neurological consultation was obtained for further recommendations. She is on continuous EEG.   EEG was consistent with myoclonic activity.  After rocuronium administration, EEG was very low voltage with low amplitude G peds.    ROS: Unable to ascertain due to current mentation and intubation  No past medical history on file.  No family history on file. Patient unable to provide past history, family history social history due to current intubation  Social History:   has no history on file for tobacco use, alcohol use, and drug use.  Medications  Current Facility-Administered Medications:  .  aspirin suppository 300 mg, 300 mg, Rectal, NOW, Lorin Glass, MD .  cefTRIAXone (ROCEPHIN) 2 g in sodium chloride 0.9 % 100 mL IVPB, 2 g, Intravenous, Q24H, Lorin Glass, MD .  feeding supplement (PROSource TF) liquid 45 mL, 45 mL, Per Tube, BID, Lorin Glass, MD .  feeding supplement (VITAL HIGH PROTEIN) liquid 1,000 mL, 1,000 mL, Per Tube, Q24H, Lorin Glass, MD .  heparin injection 5,000 Units, 5,000 Units, Subcutaneous, Q8H, Lorin Glass, MD, 5,000 Units  at 10/11/2019 2337 .  insulin aspart (novoLOG) injection 0-15 Units, 0-15 Units, Subcutaneous, Q4H, Lorin Glass, MD, 3 Units at 11/09/2019 1858 .  lactated ringers infusion, , Intravenous, Continuous, Tobey Grim, NP, Last Rate: 100 mL/hr at 11/07/2019 2309, New Bag at 10/15/2019 2309 .  midazolam (VERSED) injection 1-2 mg, 1-2 mg, Intravenous, Q2H PRN, Karl Ito, MD, 2 mg at 11/02/2019 2045 .  norepinephrine (LEVOPHED) 4mg  in premix infusion, 0-10 mcg/min, Intravenous, Titrated, , MD .  pantoprazole (PROTONIX) injection 40 mg, 40 mg, Intravenous, QHS, Lorin Glass, MD, 40 mg at 10/20/2019 2337 .  propofol (DIPRIVAN) 1000 MG/100ML infusion, 5-80 mcg/kg/min, Intravenous, Titrated, 2338, NP, Last Rate: 2.23 mL/hr at 11/03/2019 2315, 5 mcg/kg/min at 11/03/2019 2315  Exam: Current vital signs: BP 116/87   Pulse (!) 125   Temp (!) 95.9 F (35.5 C) (Bladder)   Resp 18   Ht 5\' 8"  (1.727 m)   Wt 74.4 kg   SpO2 100%   BMI 24.95 kg/m  Vital signs in last 24 hours: Temp:  [95.9 F (35.5 C)] 95.9 F (35.5 C) (10/06 2135) Pulse Rate:  [116-146] 125 (10/06 2330) Resp:  [0-29] 18 (10/06 2330) BP: (71-164)/(37-113) 116/87 (10/06 2330) SpO2:  [99 %-100 %] 100 % (10/06 2330) Arterial Line BP: (97-138)/(72-93) 126/72 (10/06 2330) FiO2 (%):  [40 %-100 %] 40 % (10/06 1940) Weight:  [74.4 kg] 74.4 kg (10/06 2135) General: Intubated, sedated on propofol. HEENT: Normocephalic atraumatic Lungs: Clear to auscultation Cardiovascular: Tachycardic and mildly hypertensive. Abdomen soft nondistended  nontender Extremities warm well perfused Neurological exam She is sedated on propofol, intubated. Intermittent fast rhythmic twitching of the eyelids noted. There was some biting on the tube as well. Off-and-on sudden spontaneous eye opening without any stimulation also noted. Cranial nerves: I could not elicit pupillary response, no cough or gag, no corneals  bilaterally. She is breathing over the ventilator. Motor exam: No spontaneous movement Sensory exam: No movement to noxious stimulation   Labs I have reviewed labs in epic and the results pertinent to this consultation are:  CBC    Component Value Date/Time   WBC 15.7 (H) 10/22/2019 1710   RBC 3.94 10/18/2019 1710   HGB 12.2 11/06/2019 1840   HCT 36.0 10/20/2019 1840   PLT 321 10/12/2019 1710   MCV 92.4 11/08/2019 1710   MCH 27.2 10/13/2019 1710   MCHC 29.4 (L) 11/05/2019 1710   RDW 17.4 (H) 10/13/2019 1710   LYMPHSABS 10.0 (H) 10/15/2019 1710   MONOABS 0.6 10/29/2019 1710   EOSABS 0.0 11/06/2019 1710   BASOSABS 0.0 10/22/2019 1710    CMP     Component Value Date/Time   NA 137 10/22/2019 2141   K 3.4 (L) 11/06/2019 2141   CL 107 10/30/2019 2141   CO2 20 (L) 10/22/2019 2141   GLUCOSE 190 (H) 11/04/2019 2141   BUN 7 11/09/2019 2141   CREATININE 0.95 10/25/2019 2141   CALCIUM 7.8 (L) 10/26/2019 2141   PROT 6.9 11/02/2019 1710   ALBUMIN 3.2 (L) 10/28/2019 1710   AST 104 (H) 10/30/2019 1710   ALT 55 (H) 11/03/2019 1710   ALKPHOS 95 10/31/2019 1710   BILITOT 0.5 10/30/2019 1710   GFRNONAA >60 10/13/2019 2141    Lipid Panel     Component Value Date/Time   TRIG 193 (H) 10/20/2019 2141     Imaging I have reviewed the images obtained: CT-scan of the brain shows diffuse cerebral edema with loss of gray-white differentiation and effacement of sulci at the vertex, effacement of lateral and third ventricle and effacement of the prepontine and quadrigeminal plate cisterns along with effacement of the suprasellar cistern without frank uncal herniation at this time.  No hemorrhage.  Assessment: 30 year old woman with past medical history of polysubstance abuse brought into the hospital after out of hospital arrest with unknown downtime and 20 minutes of compression prior to ROSC with her remaining unresponsive throughout. She is currently on 36 Celsius hypothermia protocol.   Noted to have fluttering of eyelids and biting on the tube concerning for seizures. CT head already reveals findings that are consistent with anoxic brain injury. Clinically, it appears that she might be exhibiting myoclonic movements. These movements subsided with rocuronium and at that time the EEG was nearly flat with very low voltage G peds. She is currently hooked up to LTM EEG.  Presence of myoclonus so early after cardiac arrest is a poor prognosticator along with CT head showing diffuse cerebral edema which is also indicative of severe anoxic brain injury.  But these factors portend poor chances for neurological meaningful recovery although it is too early in the course for a proper prognostication yet.  Recommendations: Load Keppra-already ordered by the PCCM providers after phone consultation with me. Keppra 750 twice daily maintenance. Can use Klonopin or Depakote in addition to Keppra for symptomatic control of the myoclonic activity. Check ammonia levels with AM labs Continue LTM-formal read in the morning.  Neurology will follow with you.   -- Milon Dikes, MD [facility] Pager: [phone] If 7pm  to 7am, please call on call as listed on AMION.   CRITICAL CARE ATTESTATION Performed by: Milon Dikes, MD Total critical care time: 35 minutes Critical care time was exclusive of separately billable procedures and treating other patients and/or supervising APPs/Residents/Students Critical care was necessary to treat or prevent imminent or life-threatening deterioration due to anoxic brain injury, post anoxic myoclonus This patient is critically ill and at significant risk for neurological worsening and/or death and care requires constant monitoring. Critical care was time spent personally by me on the following activities: development of treatment plan with patient and/or surrogate as well as nursing, discussions with consultants, evaluation of patient's response to  treatment, examination of patient, obtaining history from patient or surrogate, ordering and performing treatments and interventions, ordering and review of laboratory studies, ordering and review of radiographic studies, pulse oximetry, re-evaluation of patient's condition, participation in multidisciplinary rounds and medical decision making of high complexity in the care of this patient.

## 2019-10-16 NOTE — ED Notes (Signed)
Erica Dixon (Mom#[phone]

## 2019-10-16 NOTE — Progress Notes (Signed)
vLTM started   Same leads for baseline EEG. Neurology notified.  [facility] monitoring

## 2019-10-16 NOTE — Progress Notes (Signed)
Transported patient from ED to 2H14 without event.

## 2019-10-17 ENCOUNTER — Inpatient Hospital Stay (HOSPITAL_COMMUNITY): Payer: Medicaid Other

## 2019-10-17 DIAGNOSIS — Z9911 Dependence on respirator [ventilator] status: Secondary | ICD-10-CM

## 2019-10-17 DIAGNOSIS — I469 Cardiac arrest, cause unspecified: Secondary | ICD-10-CM | POA: Diagnosis not present

## 2019-10-17 DIAGNOSIS — J9601 Acute respiratory failure with hypoxia: Secondary | ICD-10-CM

## 2019-10-17 LAB — ECHOCARDIOGRAM COMPLETE
AR max vel: 3.23 cm2
AV Area VTI: 3.41 cm2
AV Area mean vel: 3.25 cm2
AV Mean grad: 2 mmHg
AV Peak grad: 3.8 mmHg
Ao pk vel: 0.98 m/s
Area-P 1/2: 4.86 cm2
Height: 68 in
S' Lateral: 2.8 cm
Weight: 3957.7 oz

## 2019-10-17 LAB — BASIC METABOLIC PANEL
Anion gap: 10 (ref 5–15)
Anion gap: 6 (ref 5–15)
BUN: 7 mg/dL (ref 6–20)
BUN: 6 mg/dL (ref 6–20)
CO2: 18 mmol/L — ABNORMAL LOW (ref 22–32)
CO2: 21 mmol/L — ABNORMAL LOW (ref 22–32)
Calcium: 7.8 mg/dL — ABNORMAL LOW (ref 8.9–10.3)
Calcium: 8.4 mg/dL — ABNORMAL LOW (ref 8.9–10.3)
Chloride: 108 mmol/L (ref 98–111)
Chloride: 112 mmol/L — ABNORMAL HIGH (ref 98–111)
Creatinine, Ser: 0.97 mg/dL (ref 0.44–1.00)
Creatinine, Ser: 0.69 mg/dL (ref 0.44–1.00)
GFR calc non Af Amer: >60 mL/min (ref >60)
GFR calc non Af Amer: >60 mL/min (ref >60)
Glucose, Bld: 127 mg/dL — ABNORMAL HIGH (ref 70–99)
Glucose, Bld: 91 mg/dL (ref 70–99)
Potassium: 4.3 mmol/L (ref 3.5–5.1)
Potassium: 4.9 mmol/L (ref 3.5–5.1)
Sodium: 136 mmol/L (ref 135–145)
Sodium: 139 mmol/L (ref 135–145)

## 2019-10-17 LAB — POCT I-STAT 7, (LYTES, BLD GAS, ICA,H+H)
Acid-Base Excess: 0 mmol/L (ref 0.0–2.0)
Acid-base deficit: 6 mmol/L — ABNORMAL HIGH (ref 0.0–2.0)
Acid-base deficit: 3 mmol/L — ABNORMAL HIGH (ref 0.0–2.0)
Acid-base deficit: 1 mmol/L (ref 0.0–2.0)
Bicarbonate: 20.6 mmol/L (ref 20.0–28.0)
Bicarbonate: 21.6 mmol/L (ref 20.0–28.0)
Bicarbonate: 24.5 mmol/L (ref 20.0–28.0)
Bicarbonate: 27 mmol/L (ref 20.0–28.0)
Calcium, Ion: 1.14 mmol/L — ABNORMAL LOW (ref 1.15–1.40)
Calcium, Ion: 1.26 mmol/L (ref 1.15–1.40)
Calcium, Ion: 1.23 mmol/L (ref 1.15–1.40)
Calcium, Ion: 1.3 mmol/L (ref 1.15–1.40)
HCT: 36 % (ref 36.0–46.0)
HCT: 33 % — ABNORMAL LOW (ref 36.0–46.0)
HCT: 31 % — ABNORMAL LOW (ref 36.0–46.0)
HCT: 31 % — ABNORMAL LOW (ref 36.0–46.0)
Hemoglobin: 12.2 g/dL (ref 12.0–15.0)
Hemoglobin: 11.2 g/dL — ABNORMAL LOW (ref 12.0–15.0)
Hemoglobin: 10.5 g/dL — ABNORMAL LOW (ref 12.0–15.0)
Hemoglobin: 10.5 g/dL — ABNORMAL LOW (ref 12.0–15.0)
O2 Saturation: 100 %
O2 Saturation: 96 %
O2 Saturation: 100 %
O2 Saturation: 100 %
Patient temperature: 36
Patient temperature: 36.9
Patient temperature: 37
Patient temperature: 37
Potassium: 3.2 mmol/L — ABNORMAL LOW (ref 3.5–5.1)
Potassium: 4.9 mmol/L (ref 3.5–5.1)
Potassium: 4.3 mmol/L (ref 3.5–5.1)
Potassium: 4.4 mmol/L (ref 3.5–5.1)
Sodium: 140 mmol/L (ref 135–145)
Sodium: 143 mmol/L (ref 135–145)
Sodium: 139 mmol/L (ref 135–145)
Sodium: 141 mmol/L (ref 135–145)
TCO2: 22 mmol/L (ref 22–32)
TCO2: 23 mmol/L (ref 22–32)
TCO2: 26 mmol/L (ref 22–32)
TCO2: 29 mmol/L (ref 22–32)
pCO2 arterial: 40.7 mmHg (ref 32.0–48.0)
pCO2 arterial: 38.2 mmHg (ref 32.0–48.0)
pCO2 arterial: 40.3 mmHg (ref 32.0–48.0)
pCO2 arterial: 62 mmHg — ABNORMAL HIGH (ref 32.0–48.0)
pH, Arterial: 7.307 — ABNORMAL LOW (ref 7.350–7.450)
pH, Arterial: 7.361 (ref 7.350–7.450)
pH, Arterial: 7.247 — ABNORMAL LOW (ref 7.350–7.450)
pH, Arterial: 7.392 (ref 7.350–7.450)
pO2, Arterial: 358 mmHg — ABNORMAL HIGH (ref 83.0–108.0)
pO2, Arterial: 88 mmHg (ref 83.0–108.0)
pO2, Arterial: 310 mmHg — ABNORMAL HIGH (ref 83.0–108.0)
pO2, Arterial: 346 mmHg — ABNORMAL HIGH (ref 83.0–108.0)

## 2019-10-17 LAB — CBC
HCT: 32.4 % — ABNORMAL LOW (ref 36.0–46.0)
Hemoglobin: 9.9 g/dL — ABNORMAL LOW (ref 12.0–15.0)
MCH: 27.5 pg (ref 26.0–34.0)
MCHC: 30.6 g/dL (ref 30.0–36.0)
MCV: 90 fL (ref 80.0–100.0)
Platelets: 266 10*3/uL (ref 150–400)
RBC: 3.6 MIL/uL — ABNORMAL LOW (ref 3.87–5.11)
RDW: 17.2 % — ABNORMAL HIGH (ref 11.5–15.5)
WBC: 15.2 10*3/uL — ABNORMAL HIGH (ref 4.0–10.5)
nRBC: 0 % (ref 0.0–0.2)

## 2019-10-17 LAB — MAGNESIUM: Magnesium: 2.3 mg/dL (ref 1.7–2.4)

## 2019-10-17 LAB — GLUCOSE, CAPILLARY
Glucose-Capillary: 105 mg/dL — ABNORMAL HIGH (ref 70–99)
Glucose-Capillary: 153 mg/dL — ABNORMAL HIGH (ref 70–99)
Glucose-Capillary: 172 mg/dL — ABNORMAL HIGH (ref 70–99)
Glucose-Capillary: 82 mg/dL (ref 70–99)
Glucose-Capillary: 86 mg/dL (ref 70–99)
Glucose-Capillary: 87 mg/dL (ref 70–99)

## 2019-10-17 LAB — AMMONIA: Ammonia: 32 umol/L (ref 9–35)

## 2019-10-17 LAB — PHOSPHORUS: Phosphorus: 1.7 mg/dL — ABNORMAL LOW (ref 2.5–4.6)

## 2019-10-17 MED ORDER — CHLORHEXIDINE GLUCONATE CLOTH 2 % EX PADS: 6.0000 | MEDICATED_PAD | Freq: Every day | CUTANEOUS | Status: DC

## 2019-10-17 MED ORDER — CHLORHEXIDINE GLUCONATE 0.12% ORAL RINSE (MEDLINE KIT)
15.0000 mL | Freq: Two times a day (BID) | OROMUCOSAL | Status: DC
  Administered 2019-10-17: 15 mL via OROMUCOSAL

## 2019-10-17 MED ORDER — POTASSIUM & SODIUM PHOSPHATES 280-160-250 MG PO PACK
1.0000 | PACK | Freq: Two times a day (BID) | ORAL | Status: DC
  Administered 2019-10-17: 1 via ORAL
  Filled 2019-10-17 (×2): qty 1

## 2019-10-17 MED ORDER — SODIUM CHLORIDE 0.9 % IV BOLUS
1000.0000 mL | Freq: Once | INTRAVENOUS | Status: AC
  Administered 2019-10-17: 1000 mL via INTRAVENOUS

## 2019-10-17 MED ORDER — PERFLUTREN LIPID MICROSPHERE
1.0000 mL | INTRAVENOUS | Status: AC | PRN
  Administered 2019-10-17: 3 mL via INTRAVENOUS
  Filled 2019-10-17: qty 10

## 2019-10-17 MED ORDER — DESMOPRESSIN ACETATE 4 MCG/ML IJ SOLN
1.0000 ug | Freq: Once | INTRAMUSCULAR | Status: AC
  Administered 2019-10-17: 1 ug via INTRAVENOUS
  Filled 2019-10-17: qty 1

## 2019-10-17 MED ORDER — LABETALOL HCL 5 MG/ML IV SOLN
5.0000 mg | Freq: Once | INTRAVENOUS | Status: AC
  Administered 2019-10-17: 5 mg via INTRAVENOUS

## 2019-10-17 MED ORDER — EPINEPHRINE 1 MG/10ML IJ SOSY
PREFILLED_SYRINGE | INTRAMUSCULAR | Status: AC
  Filled 2019-10-17: qty 20

## 2019-10-17 MED ORDER — ORAL CARE MOUTH RINSE
15.0000 mL | OROMUCOSAL | Status: DC
  Administered 2019-10-17 (×6): 15 mL via OROMUCOSAL

## 2019-10-17 MED ORDER — POTASSIUM CHLORIDE 20 MEQ/15ML (10%) PO SOLN
40.0000 meq | Freq: Once | ORAL | Status: AC
  Administered 2019-10-17: 40 meq
  Filled 2019-10-17: qty 30

## 2019-10-17 MED ORDER — MAGNESIUM SULFATE 4 GM/100ML IV SOLN
4.0000 g | Freq: Once | INTRAVENOUS | Status: AC
  Administered 2019-10-17: 4 g via INTRAVENOUS
  Filled 2019-10-17: qty 100

## 2019-10-18 LAB — BPAM RBC
Blood Product Expiration Date: 202110222359
Blood Product Expiration Date: 202110222359
Unit Type and Rh: 6200
Unit Type and Rh: 6200

## 2019-10-18 LAB — TYPE AND SCREEN
ABO/RH(D): A POS
Antibody Screen: POSITIVE
Donor AG Type: NEGATIVE
Donor AG Type: NEGATIVE
Unit division: 0
Unit division: 0

## 2019-10-21 LAB — CULTURE, BLOOD (ROUTINE X 2)
Culture: NO GROWTH
Culture: NO GROWTH
Special Requests: ADEQUATE
Special Requests: ADEQUATE

## 2019-11-11 NOTE — Progress Notes (Signed)
Brain death neuro exam performed with RT and bedside RNs present. Family updated prior to proceeding, but they requested to not be present during exam.  Patient without response to verbal stimulation, bilateral trapezius squeeze, or sternal rub.  No withdraw from any extremity with deep nailbed pressure.  No pupillary, corneal, oculocephalic reflexes.  Negative cold calorics bilaterally.  No pharyngeal cough or tracheal gag reflex. Pre-oxygenated for apnea test and baseline ABG drawn with PCO2 40. Apnea test performed with 8L O2 flow, no desaturations. Follow up ABG drawn after 8 minutes with PCO2 62, no respirations.   Time of death 18:58 due to brain death.  Family and CDS updated.  Steffanie Dunn, DO 20-Oct-2019 7:36 PM [facility]

## 2019-11-11 NOTE — Progress Notes (Signed)
LTM maint complete P3 fixed - no skin breakdown under: Fp1, F7, A1

## 2019-11-11 NOTE — Death Summary Note (Signed)
Physician Discharge Summary  Patient ID: Erica Dixon MRN: [id number] DOB/AGE: 19-Feb-1990 29 y.o.  Admit date: 2019/11/03 Discharge date: November 04, 2019  Admission Diagnoses: Asystolic cardiac arrest Drug overdose Acute hypoxic and hypercapnic respiratory failure Hypokalemia Hyperglycemia AKI   Discharge Diagnoses:  Active Problems:   Cardiac arrest [facility]) Asystolic cardiac arrest Drug overdose Acute hypoxic and hypercapnic respiratory failure Hypokalemia Hyperglycemia AKI Anoxic brain injury Diabetes insipidus hypophosphatemia  Discharged Condition: deceased  Hospital Course: Admitted post-arrest, intubated, managed without TTM with goal of maintaining normothermia and avoiding fevers. She developed seizures like activity and fixed dilated pupils. She underwent repeat head CT demonstrating worsening cerebral edema. She suddenly began producing large volume very light urine which responded to DDAVP. Due to concerning EEG, CT, and physical exam findings, brain death testing as performed, including an apnea test. She was declared brain dead at 18:58 on 2019/11/04. Family was at the hospital and was notified.The ventilator was discontinued after family had time to say goodbye. CDS notified and spoke with family.  Consults:  Neurology  Significant Diagnostic Studies:  EEG 11/04/22: This study initially showed evidence of myoclonic seizure which stopped after midnight. There is also evidence ofsuggestive of profound diffuse encephalopathy, non specific to etiology but could be related to hypoxic-anoxic brain injury.  CT head 11/04/22:  Severe global anoxic injury with worsening swelling    Treatments:  Mechanical ventilation Antibiotics DDAVP Insulin Electrolyte repletion    Disposition: funeral home of family's choosing     Signed: Steffanie Dunn 11-04-19, 7:37 PM

## 2019-11-11 NOTE — Progress Notes (Signed)
Family updated at bedside. Planning for apnea test after family has had a few minutes with her.  Steffanie Dunn, DO 10/15/2019 6:28 PM [facility]

## 2019-11-11 NOTE — Progress Notes (Signed)
CDS referral # [id number] Potential Donor.

## 2019-11-11 NOTE — Progress Notes (Signed)
Assisted tele visit to patient with father. ° °Tyreke Kaeser P, RN  °

## 2019-11-11 NOTE — Death Summary Note (Signed)
       The pt was pronounced brain dead prior to my arrival at 1858 on 10/30/2019. CDS spoke with the family who chose to not to partake in donation. The patients family left the bedside and were ready to withdrawal care. She was extubated at 2050 with RT. The patient passed with myself at the bedside. Cardiac time of death was May 05, 2118 and was pronounced by myself, Blane Ohara and Alesia Banda.  CDS, patient placement, and the medical examiner were all notified about the time of death and the post mortem checklist was complete.

## 2019-11-11 NOTE — Progress Notes (Signed)
eLink Physician-Brief Progress Note Patient Name: Erica Dixon DOB: November 11, 1990 MRN: [id number]   Date of Service  11/03/2019  HPI/Events of Note  Hypotension - BP = 76/47 with MAP = 57. Suspect that she has herniated.  eICU Interventions  Plan: 1. Bolus with 0.9 NaCl 1 liver IV over 1 hour now. 2. Norepinephrine IV infusion via PIV. Titrate to MAP >= 65. 3. Ground team notified of need for CVL.     Intervention Category Major Interventions: Hypotension - evaluation and management  Cherise Fedder Eugene 10/29/2019, 4:31 AM

## 2019-11-11 NOTE — Progress Notes (Signed)
[facility] ADULT ICU REPLACEMENT PROTOCOL   The patient does apply for the [facility] Adult ICU Electrolyte Replacment Protocol based on the criteria listed below:   1. Is GFR >/= 30 ml/min? Yes.    Patient's GFR today is >60 2. Is SCr </= 2? Yes.   Patient's SCr is 0.95 ml/kg/hr 3. Did SCr increase >/= 0.5 in 24 hours? No. 4. Abnormal electrolyte(s): k 3.4, Mag 1.6 5. Ordered repletion with: protocol 6. If a panic level lab has been reported, has the CCM MD in charge been notified? No..   Physician:    Markus Daft A 10/15/2019 12:09 AM

## 2019-11-11 NOTE — Procedures (Signed)
Extubation Procedure Note  Pt declared brain dead per apnea test. Verbal order per Dr. Chestine Spore to extubate.   Patient Details:   Name: Erica Dixon DOB: 11/14/1990 MRN: [id number]   Airway Documentation:    Vent end date: 11/05/2019 Vent end time: 2050   Evaluation     Lianne Bushy 11/05/2019, 9:01 PM

## 2019-11-11 NOTE — Progress Notes (Addendum)
Called by RN re: pupils fixed and dilated.  Examined pt at bedside. Pupils 81mm, non reactive and fixed b/l. No spontaneous movements or response to nox stim Breathing over the vent.  Exam concerning for central herniation as she already had diffuse cerebral edema on admission CTH.   Recs: STAT CTH w/o contrast. Will follow  -- Milon Dikes, MD [facility]    Addendum CT head completed. Worsening cerebral edema with crowding at the foramen magnum along with diminishing size of the ventricles.  Also pseudosubarachnoid pattern consistent with anoxic cervical edema. I suspect she would progress to brain death within the next few hours.  As of last check, she was still breathing over the ventilator. I spoke with Dr. Arsenio Loader from [facility] and updated him on this.  -- Milon Dikes, MD

## 2019-11-11 NOTE — Significant Event (Addendum)
Have spoke to family regarding current clinical condition. Pupils are fixed and dilated, no cough, no gag, no corneal, eeg appears flat, Head CT with extensive cerebral swelling. Have spoken about possibility for brain death testing today. They are understanding of current condition. At this time agree with DNR status.   Currently on 3 mcg/min levophed will hold off on CVC placement at this time given above.
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
## 2019-11-11 NOTE — Progress Notes (Signed)
eLink Physician-Brief Progress Note Patient Name: Erica Dixon DOB: 1990/02/13 MRN: [id number]   Date of Service  2019/10/24  HPI/Events of Note  Nursing reports dilated R pupil. Initial Head CT Scan revealed diffuse mass effect most in keeping with moderate cerebral edema without frank herniation at this time. No superimposed acute intracranial hemorrhage or infarct. I suspect that she is starting to herniate in the setting of cardiac arrest with prolonged down time.   eICU Interventions  Plan: 1. Continue present management.  2. Nursing to contact Neurology for their input.      Intervention Category Major Interventions: Other:  Erica Dixon October 24, 2019, 3:02 AM

## 2019-11-11 NOTE — Progress Notes (Signed)
RT note- Apnea test performed with MD at bedside, cuff deflated and 8L O2 down ETT, pre and post ABG done per protocol.

## 2019-11-11 NOTE — Progress Notes (Signed)
  Echocardiogram 2D Echocardiogram has been performed with Definity.  Gerda Diss 11/05/2019, 8:37 AM

## 2019-11-11 NOTE — Progress Notes (Signed)
Pt transported to CT and back to 2H14 without any complications.

## 2019-11-11 NOTE — Progress Notes (Signed)
Initial Nutrition Assessment  DOCUMENTATION CODES:   Not applicable  INTERVENTION:   If supportive care continues, recommend begin TF via OG tube: Vital High Protein at 20 ml/h, increase by 10 ml every 4 hours to goal rate of 65 ml/h  Provides 1560 kcal, 137 gm protein, 1304 ml free water daily  NUTRITION DIAGNOSIS:   Inadequate oral intake related to inability to eat as evidenced by NPO status.  GOAL:   Provide needs based on ASPEN/SCCM guidelines  MONITOR:   Labs, I & O's  REASON FOR ASSESSMENT:   Consult Enteral/tube feeding initiation and management  ASSESSMENT:   29 yo female admitted S/P OOH cardiac arrest s/p overdose of "triple C" cough syrup. PMH includes ADD, schizoaffective disorder, opiate use.  Discussed patient in ICU rounds and with CCM physician today. Prognosis is poor. Brain death testing may be completed today. Will begin trickle TF this afternoon if supportive care continues.  OG tube in place.  Patient is currently intubated on ventilator support MV: 7.8 L/min Temp (24hrs), Avg:96.6 F (35.9 C), Min:95.9 F (35.5 C), Max:98.6 F (37 C)   Labs reviewed.  CBG: 87-82  Medications reviewed and include Levophed, Keppra, Novolog, Phos-nak. IVF: LR at 100 ml/h  Diet Order:   Diet Order    None      EDUCATION NEEDS:   Not appropriate for education at this time  Skin:  Skin Assessment: Reviewed RN Assessment  Last BM:  no BM documented  Height:   Ht Readings from Last 1 Encounters:  10/11/2019 5\' 8"  (1.727 m)    Weight:   Wt Readings from Last 1 Encounters:  10/16/2019 112.2 kg    Ideal Body Weight:  63.6 kg  BMI:  Body mass index is 37.61 kg/m.  Estimated Nutritional Needs:   Kcal:  12/17/19  Protein:  125 gm  Fluid:  >/= 1.8 L    [id number], RD, LDN, CNSC Please refer to Amion for contact information.

## 2019-11-11 NOTE — Progress Notes (Signed)
NAME:  Erica Dixon, MRN:  [id number], DOB:  1990/11/20, LOS: 1 ADMISSION DATE:  2019-11-10, CONSULTATION DATE:  11/10/2019 REFERRING MD:  Madilyn Hook, CHIEF COMPLAINT:  Cardiac arrest   Brief History   29 year old woman with hx of polysubstance abuse presenting after OOH cardiac arrest.  History of present illness   29 year old woman with hx of polysubstance abuse presenting after OOH cardiac arrest.  Initial rhythm PEA.  Unknown downtime prior to compressions. 20 minutes compressions before ROSC.  Unresponsive. Per report may have took some "triple C" cough syrup.  Head CT pending. PCCM consulted for admission, no family at bedisde.  Past Medical History  ADD Schizoaffective disorder Opiate use disorder  Significant Hospital Events   10/6 admitted  Consults:  Neurology  Procedures:  ETT10/6 > A-line 10/6 >  Significant Diagnostic Tests:  CXR 10/6 > low ETT, lungs otherwise benign CT head 10/6 > Moderate cerebral edema without frank herniation at this time.  CTA Chest 10/6 > Resolving multifactorial pulmonary infiltrates   CT head 10/7 > Severe global anoxic injury with worsening swelling  Micro Data:  COVID > Negative  MRSA PCR > negative   Antimicrobials:  none   Interim history/subjective:  Unresponsive   Objective   Blood pressure 103/68, pulse (!) 115, temperature (!) 95.9 F (35.5 C), resp. rate 16, height 5\' 8"  (1.727 m), weight 112.2 kg, SpO2 100 %.    Vent Mode: PRVC FiO2 (%):  [40 %-100 %] 40 % Set Rate:  [16 bmp-20 bmp] 20 bmp Vt Set:  [510 mL] 510 mL PEEP:  [5 cmH20] 5 cmH20 Plateau Pressure:  [15 cmH20-18 cmH20] 18 cmH20   Intake/Output Summary (Last 24 hours) at 11/04/2019 12/17/2019 Last data filed at 10/15/2019 12/17/2019 Gross per 24 hour  Intake 2325.88 ml  Output 2995 ml  Net -669.12 ml   Filed Weights   11-10-19 2135 10/13/2019 0500  Weight: 74.4 kg 112.2 kg    Examination: General: Adult obese female lying in bed unresponsive HEENT: ETT, MM  pink/moist, PERRL Neuro: UNresponisve CV: s1s2 regular rate and rhythm, no murmur, rubs, or gallops,  PULM:  Mechanical breath sounds, no increased work of breathing, no added breath sounds GI: soft, bowel sounds active in all 4 quadrants, non-tender, non-distended Extremities: warm/dry, no edema  Skin: no rashes or lesions  Resolved Hospital Problem list   n/a  Assessment & Plan:  OOH PEA arrest - 20 minutes CPR.  Hx polysubstance abuse.  Current neuro exam poor. Circulatory shock - Unknown downtime P: Continue TTM for 24hrs, goal 36 degrees. A-line in place  Continue levophed PRN, goal MAP > 65 ECHO pending  UDS positive for cocaine Trend troponin, lactate.  Respiratory insufficiency / Acute hypoxic respiratory failure  - In the setting of PEA arrest  P: Continue ventilator support with lung protective strategies  Wean PEEP and FiO2 for sats greater than 90%. Head of bed elevated 30 degrees. Plateau pressures less than 30 cm H20.  Follow intermittent chest x-ray and ABG.   Hold SBT until off NMB. Ensure adequate pulmonary hygiene  Follow cultures  VAP bundle in place  PAD protocol  Severe cerebral edema with likely progression to brain death  -Patient developed fixed and dilated pupils overnight with repeat head CT revealing worsening cerebral edema.  P: Neurology consulted  Currently requiring no sedation  No cough, gag, corneal reflex, and no breathing over the vent currently  LTM currently in place  Continue Keppra  Will discuss  with family regarding plan of care, patient is currently displaying signs of brain death it could be reasonable to pursue brain death testing this evening at 24hr TTM or if family wishes we can continue current measures and give full 72 hrs for prognostication   Leukocytosis  -Mild leukocytosis with WBC 15.7 P: No clinical signs of bacterial infection  Trend CBC and fever curve   Best practice:  Diet: TF Pain/Anxiety/Delirium  protocol (if indicated): PRN for now VAP protocol (if indicated): in place DVT prophylaxis: SCDs pending head CT GI prophylaxis: PPI Glucose control: SSI Mobility: BR Code Status: Full Family Communication: Family update by team on admission and overnight, will update again this AM once arrived  Disposition: ICU  Labs   CBC: Recent Labs  Lab 10-24-19 1710 24-Oct-2019 1722 October 24, 2019 1840 11/03/2019 0124 10/12/2019 0455  WBC 15.7*  --   --   --  15.2*  NEUTROABS 5.0  --   --   --   --   HGB 10.7* 12.2 12.2 12.2 9.9*  HCT 36.4 36.0 36.0 36.0 32.4*  MCV 92.4  --   --   --  90.0  PLT 321  --   --   --  266    Basic Metabolic Panel: Recent Labs  Lab 2019-10-24 1710 10/24/2019 1710 24-Oct-2019 1722 Oct 24, 2019 1840 2019-10-24 2141 11/05/2019 0124 10/27/2019 0455  NA 135   < > 136 137 137 140 136  K 3.8   < > 3.7 3.0* 3.4* 3.2* 4.3  CL 103  --  101  --  107  --  108  CO2 15*  --   --   --  20*  --  18*  GLUCOSE 268*  --  260*  --  190*  --  127*  BUN 5*  --  5*  --  7  --  7  CREATININE 1.44*  --  1.30*  --  0.95  --  0.97  CALCIUM 8.8*  --   --   --  7.8*  --  7.8*  MG  --   --   --   --  1.6*  --  2.3  PHOS  --   --   --   --  2.8  --  1.7*   < > = values in this interval not displayed.   GFR: Estimated Creatinine Clearance: 113.4 mL/min (by C-G formula based on SCr of 0.97 mg/dL). Recent Labs  Lab 24-Oct-2019 1710 10/24/2019 1711 10-24-19 2142 10/13/2019 0455  WBC 15.7*  --   --  15.2*  LATICACIDVEN  --  7.1* 2.6*  --     Liver Function Tests: Recent Labs  Lab 10-24-2019 1710  AST 104*  ALT 55*  ALKPHOS 95  BILITOT 0.5  PROT 6.9  ALBUMIN 3.2*   No results for input(s): LIPASE, AMYLASE in the last 168 hours. Recent Labs  Lab 10/29/2019 0455  AMMONIA 32    ABG    Component Value Date/Time   PHART 7.307 (L) 11/01/2019 0124   PCO2ART 40.7 10/11/2019 0124   PO2ART 358 (H) 11/06/2019 0124   HCO3 20.6 11/10/2019 0124   TCO2 22 10/18/2019 0124   ACIDBASEDEF 6.0 (H) 10/30/2019  0124   O2SAT 100.0 10/22/2019 0124     Coagulation Profile: Recent Labs  Lab 24-Oct-2019 1710  INR 1.1    Cardiac Enzymes: No results for input(s): CKTOTAL, CKMB, CKMBINDEX, TROPONINI in the last 168 hours.  HbA1C: Hgb A1c MFr Bld  Date/Time  Value Ref Range Status  Nov 02, 2019 09:41 PM 5.6 4.8 - 5.6 % Final    Comment:    (NOTE) Pre diabetes:          5.7%-6.4%  Diabetes:              >6.4%  Glycemic control for   <7.0% adults with diabetes     CBG: Recent Labs  Lab 2019-11-02 1853 11/02/2019 2139 2019-11-02 2327 10/18/2019 0003 10/19/2019 0337  GLUCAP 197* 171* 169* 172* 153*      Critical care time:   CRITICAL CARE Performed by: Delfin Gant   Total critical care time: 40 minutes  Critical care time was exclusive of separately billable procedures and treating other patients.  Critical care was necessary to treat or prevent imminent or life-threatening deterioration.  Critical care was time spent personally by me on the following activities: development of treatment plan with patient and/or surrogate as well as nursing, discussions with consultants, evaluation of patient's response to treatment, examination of patient, obtaining history from patient or surrogate, ordering and performing treatments and interventions, ordering and review of laboratory studies, ordering and review of radiographic studies, pulse oximetry and re-evaluation of patient's condition.  Delfin Gant, NP-C [facility] Contact / Pager information can be found on Amion  11/02/2019, 8:47 AM

## 2019-11-11 DEATH — deceased
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
## 2022-02-21 IMAGING — DX DG ANKLE 2V *L*
1 series · 2 of 2 positions shown · non-contrast
Comparison: No prior.

CLINICAL DATA: Left ankle pain.

EXAM:
LEFT ANKLE - 2 VIEW

[Series 1: ankle · 0.14mm/px · 2 of 2 slices shown]
[im 1/2]
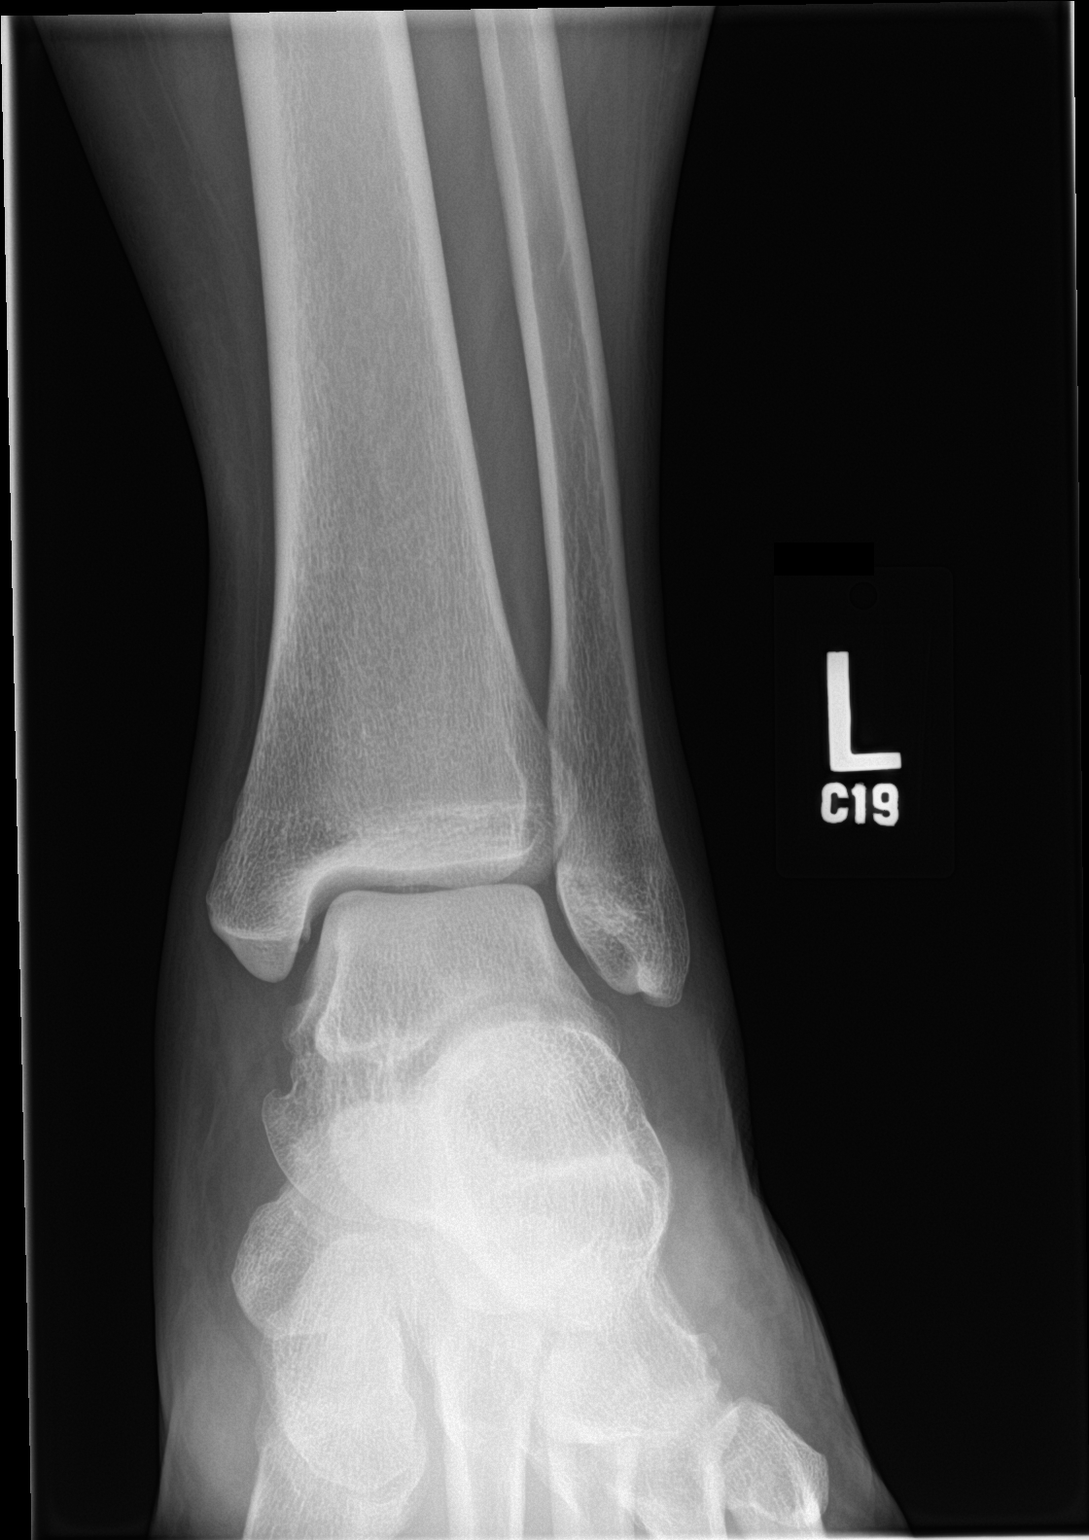
[im 2/2]
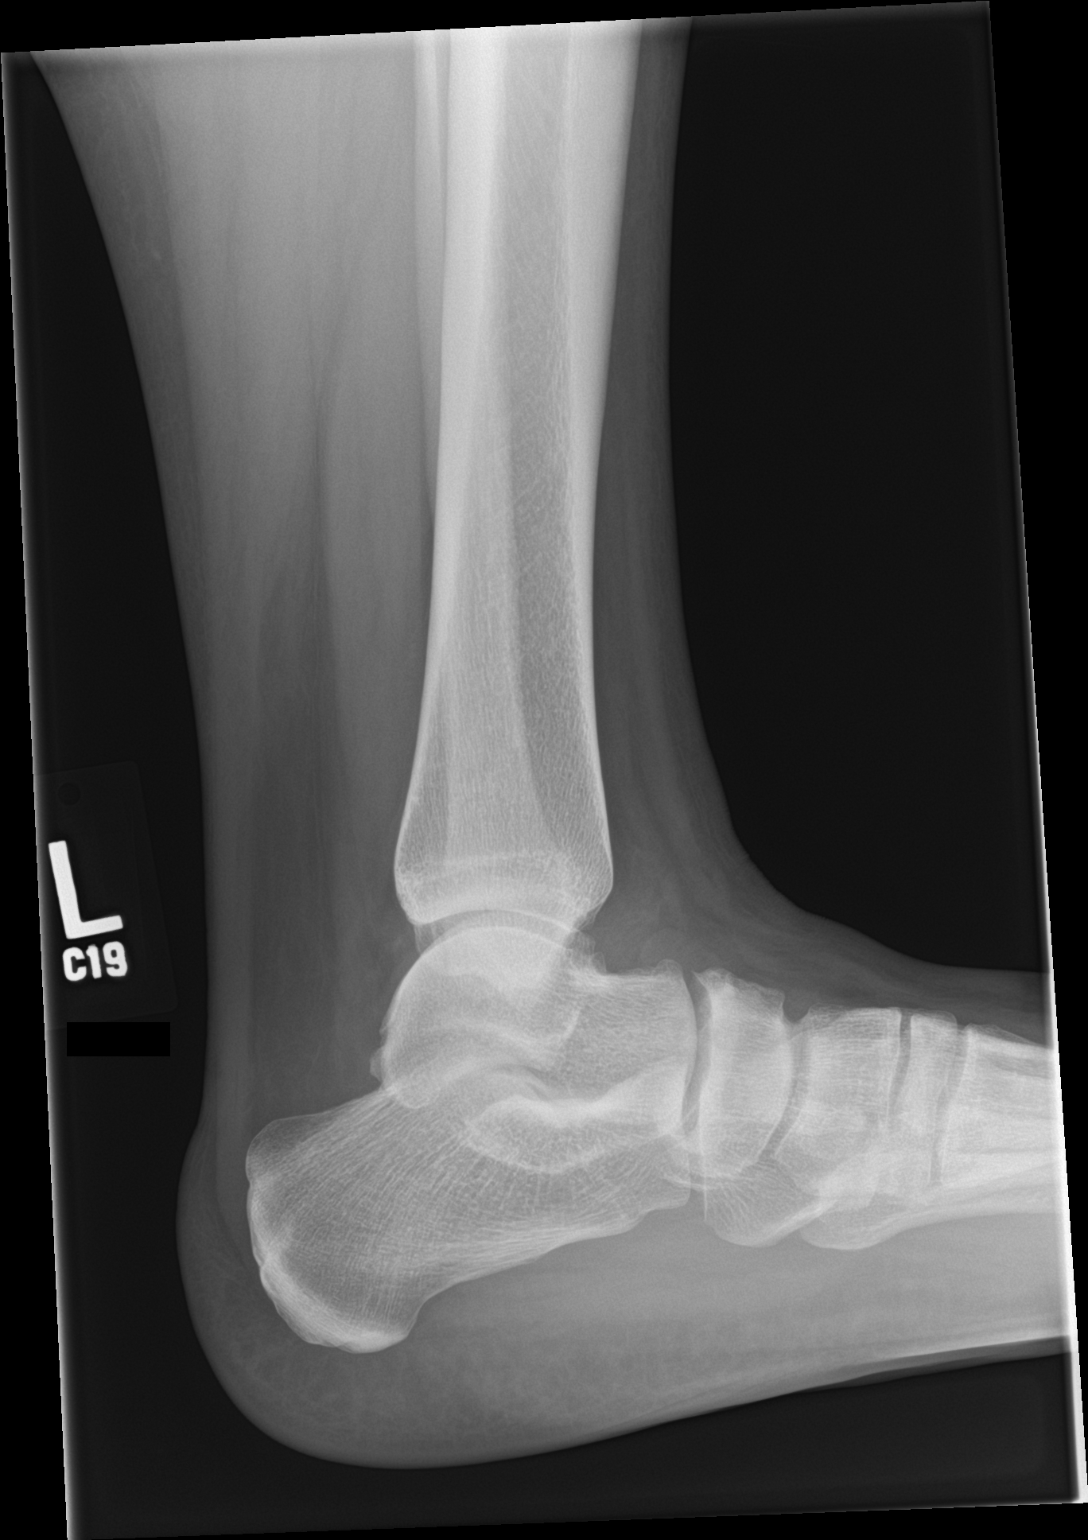

[2 of 2 positions shown; findings below may reference images not displayed]

FINDINGS: Mild soft tissue swelling. No acute bony or joint abnormality. No
evidence of fracture dislocation.
IMPRESSION: Mild soft tissue swelling.  No acute bony abnormality.
